# Patient Record
Sex: Female | Born: 1935 | Hispanic: No | Smoking: Current every day smoker
Health system: Southern US, Community
[De-identification: ages and names within clinical notes are randomized; demographics above are authoritative.]

## PROBLEM LIST (undated history)

## (undated) DIAGNOSIS — D649 Anemia, unspecified: Secondary | ICD-10-CM

## (undated) DIAGNOSIS — C801 Malignant (primary) neoplasm, unspecified: Secondary | ICD-10-CM

## (undated) DIAGNOSIS — Z87891 Personal history of nicotine dependence: Secondary | ICD-10-CM

## (undated) DIAGNOSIS — I10 Essential (primary) hypertension: Secondary | ICD-10-CM

## (undated) DIAGNOSIS — C50219 Malignant neoplasm of upper-inner quadrant of unspecified female breast: Secondary | ICD-10-CM

## (undated) DIAGNOSIS — I890 Lymphedema, not elsewhere classified: Secondary | ICD-10-CM

## (undated) DIAGNOSIS — E038 Other specified hypothyroidism: Secondary | ICD-10-CM

## (undated) DIAGNOSIS — J189 Pneumonia, unspecified organism: Secondary | ICD-10-CM

## (undated) DIAGNOSIS — E785 Hyperlipidemia, unspecified: Secondary | ICD-10-CM

## (undated) DIAGNOSIS — K639 Disease of intestine, unspecified: Secondary | ICD-10-CM

## (undated) DIAGNOSIS — R0602 Shortness of breath: Secondary | ICD-10-CM

## (undated) DIAGNOSIS — Z1211 Encounter for screening for malignant neoplasm of colon: Secondary | ICD-10-CM

## (undated) DIAGNOSIS — E669 Obesity, unspecified: Secondary | ICD-10-CM

## (undated) DIAGNOSIS — M1990 Unspecified osteoarthritis, unspecified site: Secondary | ICD-10-CM

## (undated) HISTORY — DX: Hyperlipidemia, unspecified: E78.5

## (undated) HISTORY — DX: Encounter for screening for malignant neoplasm of colon: Z12.11

## (undated) HISTORY — DX: Malignant (primary) neoplasm, unspecified: C80.1

## (undated) HISTORY — PX: CORNEAL TRANSPLANT: SHX108

## (undated) HISTORY — DX: Personal history of nicotine dependence: Z87.891

## (undated) HISTORY — DX: Other specified hypothyroidism: E03.8

## (undated) HISTORY — DX: Anemia, unspecified: D64.9

## (undated) HISTORY — DX: Pneumonia, unspecified organism: J18.9

## (undated) HISTORY — DX: Unspecified osteoarthritis, unspecified site: M19.90

## (undated) HISTORY — DX: Lymphedema, not elsewhere classified: I89.0

## (undated) HISTORY — DX: Essential (primary) hypertension: I10

## (undated) HISTORY — DX: Obesity, unspecified: E66.9

## (undated) HISTORY — DX: Disease of intestine, unspecified: K63.9

## (undated) HISTORY — DX: Malignant neoplasm of upper-inner quadrant of unspecified female breast: C50.219

## (undated) HISTORY — DX: Shortness of breath: R06.02

---

## 1963-11-14 HISTORY — PX: KNEE SURGERY: SHX244

## 1965-11-13 HISTORY — PX: BREAST SURGERY: SHX581

## 1983-11-14 HISTORY — PX: COLONOSCOPY: SHX174

## 1995-11-14 DIAGNOSIS — C801 Malignant (primary) neoplasm, unspecified: Secondary | ICD-10-CM

## 1995-11-14 HISTORY — DX: Malignant (primary) neoplasm, unspecified: C80.1

## 1996-02-12 HISTORY — PX: BASAL CELL CARCINOMA EXCISION: SHX1214

## 1996-11-13 HISTORY — PX: TYMPANOSTOMY TUBE PLACEMENT: SHX32

## 2008-11-03 ENCOUNTER — Ambulatory Visit: Payer: Self-pay | Admitting: Vascular Surgery

## 2008-11-12 ENCOUNTER — Ambulatory Visit: Payer: Self-pay | Admitting: Vascular Surgery

## 2008-11-18 ENCOUNTER — Inpatient Hospital Stay: Payer: Self-pay | Admitting: Vascular Surgery

## 2008-11-18 HISTORY — PX: ABDOMINAL AORTIC ANEURYSM REPAIR: SUR1152

## 2010-11-13 DIAGNOSIS — C50219 Malignant neoplasm of upper-inner quadrant of unspecified female breast: Secondary | ICD-10-CM

## 2010-11-13 DIAGNOSIS — J189 Pneumonia, unspecified organism: Secondary | ICD-10-CM

## 2010-11-13 DIAGNOSIS — E785 Hyperlipidemia, unspecified: Secondary | ICD-10-CM

## 2010-11-13 DIAGNOSIS — K639 Disease of intestine, unspecified: Secondary | ICD-10-CM

## 2010-11-13 DIAGNOSIS — R0602 Shortness of breath: Secondary | ICD-10-CM

## 2010-11-13 DIAGNOSIS — D649 Anemia, unspecified: Secondary | ICD-10-CM

## 2010-11-13 DIAGNOSIS — M1990 Unspecified osteoarthritis, unspecified site: Secondary | ICD-10-CM

## 2010-11-13 HISTORY — PX: OTHER SURGICAL HISTORY: SHX169

## 2010-11-13 HISTORY — PX: PORTACATH PLACEMENT: SHX2246

## 2010-11-13 HISTORY — DX: Malignant neoplasm of upper-inner quadrant of unspecified female breast: C50.219

## 2010-11-13 HISTORY — DX: Hyperlipidemia, unspecified: E78.5

## 2010-11-13 HISTORY — DX: Unspecified osteoarthritis, unspecified site: M19.90

## 2010-11-13 HISTORY — DX: Disease of intestine, unspecified: K63.9

## 2010-11-13 HISTORY — DX: Pneumonia, unspecified organism: J18.9

## 2010-11-13 HISTORY — DX: Anemia, unspecified: D64.9

## 2010-11-13 HISTORY — PX: MASTECTOMY: SHX3

## 2010-11-13 HISTORY — DX: Shortness of breath: R06.02

## 2011-02-17 ENCOUNTER — Emergency Department: Payer: Self-pay | Admitting: Emergency Medicine

## 2011-02-22 ENCOUNTER — Ambulatory Visit: Payer: Self-pay | Admitting: Internal Medicine

## 2011-02-23 LAB — CANCER ANTIGEN 27.29: CA 27.29: 102 U/mL — ABNORMAL HIGH (ref 0.0–38.6)

## 2011-02-27 ENCOUNTER — Ambulatory Visit: Payer: Self-pay | Admitting: Internal Medicine

## 2011-03-08 ENCOUNTER — Ambulatory Visit: Payer: Self-pay | Admitting: Vascular Surgery

## 2011-03-14 ENCOUNTER — Ambulatory Visit: Payer: Self-pay | Admitting: Internal Medicine

## 2011-03-31 ENCOUNTER — Encounter: Payer: Self-pay | Admitting: Internal Medicine

## 2011-04-14 ENCOUNTER — Ambulatory Visit: Payer: Self-pay | Admitting: Internal Medicine

## 2011-04-14 ENCOUNTER — Encounter: Payer: Self-pay | Admitting: Internal Medicine

## 2011-04-20 ENCOUNTER — Ambulatory Visit: Payer: Self-pay | Admitting: Internal Medicine

## 2011-05-03 ENCOUNTER — Inpatient Hospital Stay: Payer: Self-pay | Admitting: Specialist

## 2011-05-14 ENCOUNTER — Ambulatory Visit: Payer: Self-pay | Admitting: Internal Medicine

## 2011-06-14 ENCOUNTER — Ambulatory Visit: Payer: Self-pay | Admitting: Internal Medicine

## 2011-07-15 ENCOUNTER — Ambulatory Visit: Payer: Self-pay | Admitting: Internal Medicine

## 2011-07-28 ENCOUNTER — Observation Stay: Payer: Self-pay | Admitting: Oncology

## 2011-08-14 ENCOUNTER — Ambulatory Visit: Payer: Self-pay | Admitting: Internal Medicine

## 2011-08-24 ENCOUNTER — Encounter: Payer: Self-pay | Admitting: Internal Medicine

## 2011-09-14 ENCOUNTER — Ambulatory Visit: Payer: Self-pay | Admitting: Internal Medicine

## 2011-09-18 ENCOUNTER — Ambulatory Visit: Payer: Self-pay | Admitting: Internal Medicine

## 2011-09-26 ENCOUNTER — Ambulatory Visit: Payer: Self-pay | Admitting: Internal Medicine

## 2011-09-27 ENCOUNTER — Ambulatory Visit: Payer: Self-pay | Admitting: General Surgery

## 2011-10-14 ENCOUNTER — Ambulatory Visit: Payer: Self-pay | Admitting: Internal Medicine

## 2011-10-16 ENCOUNTER — Ambulatory Visit: Payer: Self-pay | Admitting: General Surgery

## 2011-10-17 ENCOUNTER — Ambulatory Visit: Payer: Self-pay | Admitting: Internal Medicine

## 2011-10-23 ENCOUNTER — Ambulatory Visit: Payer: Self-pay | Admitting: General Surgery

## 2011-10-25 LAB — PATHOLOGY REPORT

## 2011-11-14 ENCOUNTER — Ambulatory Visit: Payer: Self-pay | Admitting: Internal Medicine

## 2011-11-14 DIAGNOSIS — E669 Obesity, unspecified: Secondary | ICD-10-CM

## 2011-11-14 DIAGNOSIS — Z1211 Encounter for screening for malignant neoplasm of colon: Secondary | ICD-10-CM

## 2011-11-14 DIAGNOSIS — E038 Other specified hypothyroidism: Secondary | ICD-10-CM

## 2011-11-14 HISTORY — DX: Other specified hypothyroidism: E03.8

## 2011-11-14 HISTORY — DX: Encounter for screening for malignant neoplasm of colon: Z12.11

## 2011-11-14 HISTORY — DX: Obesity, unspecified: E66.9

## 2011-11-15 ENCOUNTER — Encounter: Payer: Self-pay | Admitting: Family Medicine

## 2011-11-21 ENCOUNTER — Ambulatory Visit: Payer: Self-pay | Admitting: Internal Medicine

## 2011-12-15 ENCOUNTER — Ambulatory Visit: Payer: Self-pay | Admitting: Internal Medicine

## 2011-12-19 LAB — CBC CANCER CENTER
Basophil #: 0 x10 3/mm (ref 0.0–0.1)
Basophil %: 0.4 %
Eosinophil #: 0.1 x10 3/mm (ref 0.0–0.7)
Eosinophil %: 1.4 %
HCT: 38.9 % (ref 35.0–47.0)
HGB: 13.3 g/dL (ref 12.0–16.0)
Lymphocyte #: 1 x10 3/mm (ref 1.0–3.6)
Lymphocyte %: 23.2 %
MCH: 33 pg (ref 26.0–34.0)
MCHC: 34.2 g/dL (ref 32.0–36.0)
Monocyte #: 0.5 x10 3/mm (ref 0.0–0.7)
Neutrophil #: 2.9 x10 3/mm (ref 1.4–6.5)
Platelet: 180 x10 3/mm (ref 150–440)
RBC: 4.04 10*6/uL (ref 3.80–5.20)
WBC: 4.4 x10 3/mm (ref 3.6–11.0)

## 2011-12-21 ENCOUNTER — Encounter: Payer: Self-pay | Admitting: Family Medicine

## 2011-12-26 LAB — CBC CANCER CENTER
Basophil %: 0.1 %
Eosinophil #: 0.1 x10 3/mm (ref 0.0–0.7)
Eosinophil %: 1 %
HCT: 39 % (ref 35.0–47.0)
HGB: 13.3 g/dL (ref 12.0–16.0)
Lymphocyte %: 12.8 %
MCH: 32.9 pg (ref 26.0–34.0)
MCHC: 34.1 g/dL (ref 32.0–36.0)
Monocyte #: 0.4 x10 3/mm (ref 0.0–0.7)
Monocyte %: 8 %
Neutrophil #: 4.3 x10 3/mm (ref 1.4–6.5)
Neutrophil %: 78.1 %
Platelet: 173 x10 3/mm (ref 150–440)
RBC: 4.04 10*6/uL (ref 3.80–5.20)

## 2012-01-02 LAB — CBC CANCER CENTER
Basophil #: 0 x10 3/mm (ref 0.0–0.1)
Basophil %: 0.3 %
Eosinophil #: 0.1 x10 3/mm (ref 0.0–0.7)
Eosinophil %: 1.4 %
HGB: 12.3 g/dL (ref 12.0–16.0)
Lymphocyte #: 0.6 x10 3/mm — ABNORMAL LOW (ref 1.0–3.6)
Lymphocyte %: 12.7 %
MCH: 32.6 pg (ref 26.0–34.0)
MCHC: 33.6 g/dL (ref 32.0–36.0)
Monocyte #: 0.5 x10 3/mm (ref 0.0–0.7)
Neutrophil %: 75.6 %
RDW: 14.5 % (ref 11.5–14.5)

## 2012-01-10 LAB — CBC CANCER CENTER
Basophil #: 0 x10 3/mm (ref 0.0–0.1)
Eosinophil #: 0 x10 3/mm (ref 0.0–0.7)
HCT: 36.9 % (ref 35.0–47.0)
Lymphocyte #: 0.6 x10 3/mm — ABNORMAL LOW (ref 1.0–3.6)
Lymphocyte %: 14.3 %
MCH: 32.6 pg (ref 26.0–34.0)
MCHC: 34.1 g/dL (ref 32.0–36.0)
Monocyte %: 11.8 %
Neutrophil #: 3 x10 3/mm (ref 1.4–6.5)
Platelet: 167 x10 3/mm (ref 150–440)
RDW: 14.5 % (ref 11.5–14.5)
WBC: 4.2 x10 3/mm (ref 3.6–11.0)

## 2012-01-12 ENCOUNTER — Ambulatory Visit: Payer: Self-pay | Admitting: Internal Medicine

## 2012-01-12 ENCOUNTER — Encounter: Payer: Self-pay | Admitting: Family Medicine

## 2012-01-23 LAB — CBC CANCER CENTER
Basophil #: 0 x10 3/mm (ref 0.0–0.1)
Eosinophil #: 0.1 x10 3/mm (ref 0.0–0.7)
Lymphocyte #: 0.5 x10 3/mm — ABNORMAL LOW (ref 1.0–3.6)
Lymphocyte %: 10.3 %
MCHC: 34.5 g/dL (ref 32.0–36.0)
MCV: 95 fL (ref 80–100)
Monocyte %: 8.7 %
Neutrophil #: 3.7 x10 3/mm (ref 1.4–6.5)
Neutrophil %: 79.6 %
Platelet: 177 x10 3/mm (ref 150–440)
RBC: 4.02 10*6/uL (ref 3.80–5.20)
RDW: 15 % — ABNORMAL HIGH (ref 11.5–14.5)
WBC: 4.7 x10 3/mm (ref 3.6–11.0)

## 2012-01-30 LAB — CBC CANCER CENTER
Basophil %: 0.2 %
Eosinophil %: 1 %
HCT: 37.2 % (ref 35.0–47.0)
Lymphocyte %: 11.2 %
MCHC: 34.3 g/dL (ref 32.0–36.0)
Monocyte %: 10.5 %
Neutrophil %: 77.1 %
Platelet: 169 x10 3/mm (ref 150–440)
RBC: 3.86 10*6/uL (ref 3.80–5.20)
RDW: 15 % — ABNORMAL HIGH (ref 11.5–14.5)
WBC: 4.3 x10 3/mm (ref 3.6–11.0)

## 2012-02-12 ENCOUNTER — Ambulatory Visit: Payer: Self-pay | Admitting: Internal Medicine

## 2012-02-23 LAB — CBC CANCER CENTER
Basophil %: 0.4 %
Eosinophil #: 0 x10 3/mm (ref 0.0–0.7)
Eosinophil %: 1 %
Lymphocyte #: 0.6 x10 3/mm — ABNORMAL LOW (ref 1.0–3.6)
MCH: 32.8 pg (ref 26.0–34.0)
MCV: 99 fL (ref 80–100)
Monocyte %: 12.9 %
Neutrophil #: 3.5 x10 3/mm (ref 1.4–6.5)
RBC: 3.85 10*6/uL (ref 3.80–5.20)
RDW: 14.7 % — ABNORMAL HIGH (ref 11.5–14.5)
WBC: 4.7 x10 3/mm (ref 3.6–11.0)

## 2012-02-23 LAB — COMPREHENSIVE METABOLIC PANEL
Albumin: 3.7 g/dL (ref 3.4–5.0)
Alkaline Phosphatase: 75 U/L (ref 50–136)
BUN: 18 mg/dL (ref 7–18)
Bilirubin,Total: 0.4 mg/dL (ref 0.2–1.0)
Chloride: 102 mmol/L (ref 98–107)
Co2: 28 mmol/L (ref 21–32)
EGFR (African American): >60
EGFR (Non-African Amer.): >60
Glucose: 93 mg/dL (ref 65–99)
Osmolality: 275 (ref 275–301)
Sodium: 137 mmol/L (ref 136–145)

## 2012-02-24 LAB — CANCER ANTIGEN 27.29: CA 27.29: 85.1 U/mL — ABNORMAL HIGH (ref 0.0–38.6)

## 2012-03-13 ENCOUNTER — Ambulatory Visit: Payer: Self-pay | Admitting: Internal Medicine

## 2012-04-13 ENCOUNTER — Ambulatory Visit: Payer: Self-pay | Admitting: Oncology

## 2012-05-13 ENCOUNTER — Ambulatory Visit: Payer: Self-pay | Admitting: Oncology

## 2012-05-31 ENCOUNTER — Ambulatory Visit: Payer: Self-pay | Admitting: Internal Medicine

## 2012-06-01 LAB — CANCER ANTIGEN 27.29: CA 27.29: 90.4 U/mL — ABNORMAL HIGH (ref 0.0–38.6)

## 2012-06-13 ENCOUNTER — Ambulatory Visit: Payer: Self-pay | Admitting: Oncology

## 2012-07-14 ENCOUNTER — Ambulatory Visit: Payer: Self-pay | Admitting: Oncology

## 2012-08-13 ENCOUNTER — Ambulatory Visit: Payer: Self-pay | Admitting: Oncology

## 2012-09-06 LAB — BASIC METABOLIC PANEL
BUN: 23 mg/dL — ABNORMAL HIGH (ref 7–18)
Chloride: 100 mmol/L (ref 98–107)
Creatinine: 0.91 mg/dL (ref 0.60–1.30)
EGFR (African American): >60
EGFR (Non-African Amer.): 58 — ABNORMAL LOW
Glucose: 97 mg/dL (ref 65–99)
Osmolality: 279 (ref 275–301)
Potassium: 4.3 mmol/L (ref 3.5–5.1)
Sodium: 138 mmol/L (ref 136–145)

## 2012-09-06 LAB — MAGNESIUM: Magnesium: 2 mg/dL

## 2012-09-13 ENCOUNTER — Ambulatory Visit: Payer: Self-pay

## 2012-09-13 ENCOUNTER — Ambulatory Visit: Payer: Self-pay | Admitting: Oncology

## 2012-09-30 ENCOUNTER — Ambulatory Visit: Payer: Self-pay | Admitting: General Surgery

## 2012-10-23 ENCOUNTER — Ambulatory Visit: Payer: Self-pay | Admitting: Oncology

## 2012-11-13 ENCOUNTER — Ambulatory Visit: Payer: Self-pay | Admitting: Oncology

## 2012-12-14 ENCOUNTER — Ambulatory Visit: Payer: Self-pay | Admitting: Oncology

## 2013-01-11 ENCOUNTER — Ambulatory Visit: Payer: Self-pay | Admitting: Oncology

## 2013-02-11 ENCOUNTER — Ambulatory Visit: Payer: Self-pay | Admitting: Oncology

## 2013-03-07 LAB — CANCER ANTIGEN 27.29: CA 27.29: 153 U/mL — ABNORMAL HIGH (ref 0.0–38.6)

## 2013-03-13 ENCOUNTER — Ambulatory Visit: Payer: Self-pay | Admitting: Oncology

## 2013-04-16 ENCOUNTER — Ambulatory Visit: Payer: Self-pay | Admitting: Oncology

## 2013-04-24 LAB — CREATININE, SERUM: Creatinine: 0.94 mg/dL (ref 0.60–1.30)

## 2013-04-29 ENCOUNTER — Encounter: Payer: Self-pay | Admitting: *Deleted

## 2013-04-29 DIAGNOSIS — I890 Lymphedema, not elsewhere classified: Secondary | ICD-10-CM | POA: Insufficient documentation

## 2013-04-29 DIAGNOSIS — D649 Anemia, unspecified: Secondary | ICD-10-CM | POA: Insufficient documentation

## 2013-04-29 DIAGNOSIS — C50219 Malignant neoplasm of upper-inner quadrant of unspecified female breast: Secondary | ICD-10-CM | POA: Insufficient documentation

## 2013-04-29 DIAGNOSIS — C801 Malignant (primary) neoplasm, unspecified: Secondary | ICD-10-CM | POA: Insufficient documentation

## 2013-05-07 LAB — PROTIME-INR
INR: 0.9
Prothrombin Time: 12.8 secs (ref 11.5–14.7)

## 2013-05-07 LAB — APTT: Activated PTT: 31.5 secs (ref 23.6–35.9)

## 2013-05-07 LAB — PLATELET COUNT: Platelet: 231 10*3/uL (ref 150–440)

## 2013-05-09 ENCOUNTER — Ambulatory Visit: Payer: Self-pay | Admitting: Oncology

## 2013-05-13 ENCOUNTER — Ambulatory Visit: Payer: Self-pay | Admitting: Oncology

## 2013-05-19 ENCOUNTER — Ambulatory Visit: Payer: Self-pay | Admitting: Oncology

## 2013-05-20 LAB — CANCER ANTIGEN 27.29: CA 27.29: 394.9 U/mL — ABNORMAL HIGH (ref 0.0–38.6)

## 2013-05-26 LAB — COMPREHENSIVE METABOLIC PANEL
Albumin: 3.1 g/dL — ABNORMAL LOW (ref 3.4–5.0)
BUN: 14 mg/dL (ref 7–18)
Bilirubin,Total: 0.3 mg/dL (ref 0.2–1.0)
Calcium, Total: 9.1 mg/dL (ref 8.5–10.1)
Chloride: 95 mmol/L — ABNORMAL LOW (ref 98–107)
EGFR (Non-African Amer.): >60
Glucose: 130 mg/dL — ABNORMAL HIGH (ref 65–99)
Osmolality: 267 (ref 275–301)
SGOT(AST): 22 U/L (ref 15–37)
SGPT (ALT): 20 U/L (ref 12–78)

## 2013-05-26 LAB — CBC CANCER CENTER
Eosinophil #: 0 x10 3/mm (ref 0.0–0.7)
HCT: 35.5 % (ref 35.0–47.0)
HGB: 12.4 g/dL (ref 12.0–16.0)
Lymphocyte %: 7.9 %
MCHC: 35 g/dL (ref 32.0–36.0)
Monocyte #: 0.7 x10 3/mm (ref 0.2–0.9)
Monocyte %: 9 %
Neutrophil %: 82.3 %
RBC: 3.75 10*6/uL — ABNORMAL LOW (ref 3.80–5.20)
RDW: 13.7 % (ref 11.5–14.5)

## 2013-06-02 ENCOUNTER — Inpatient Hospital Stay: Payer: Self-pay | Admitting: Internal Medicine

## 2013-06-02 LAB — COMPREHENSIVE METABOLIC PANEL
Albumin: 3.3 g/dL — ABNORMAL LOW (ref 3.4–5.0)
Alkaline Phosphatase: 73 U/L (ref 50–136)
Bilirubin,Total: 0.3 mg/dL (ref 0.2–1.0)
Chloride: 96 mmol/L — ABNORMAL LOW (ref 98–107)
EGFR (Non-African Amer.): >60
Glucose: 100 mg/dL — ABNORMAL HIGH (ref 65–99)
SGOT(AST): 29 U/L (ref 15–37)
SGPT (ALT): 25 U/L (ref 12–78)
Sodium: 131 mmol/L — ABNORMAL LOW (ref 136–145)

## 2013-06-02 LAB — CBC
HCT: 36 % (ref 35.0–47.0)
HGB: 12.4 g/dL (ref 12.0–16.0)
MCH: 32.2 pg (ref 26.0–34.0)
Platelet: 272 10*3/uL (ref 150–440)
RBC: 3.86 10*6/uL (ref 3.80–5.20)
RDW: 13.7 % (ref 11.5–14.5)

## 2013-06-02 LAB — PRO B NATRIURETIC PEPTIDE: B-Type Natriuretic Peptide: 270 pg/mL (ref 0–450)

## 2013-06-02 LAB — CK TOTAL AND CKMB (NOT AT ARMC)
CK, Total: 93 U/L (ref 21–215)
CK, Total: 88 U/L (ref 21–215)

## 2013-06-03 LAB — BASIC METABOLIC PANEL
BUN: 13 mg/dL (ref 7–18)
Calcium, Total: 8.8 mg/dL (ref 8.5–10.1)
Co2: 26 mmol/L (ref 21–32)
Creatinine: 0.79 mg/dL (ref 0.60–1.30)
EGFR (African American): >60
Glucose: 152 mg/dL — ABNORMAL HIGH (ref 65–99)
Osmolality: 264 (ref 275–301)
Sodium: 130 mmol/L — ABNORMAL LOW (ref 136–145)

## 2013-06-03 LAB — CBC WITH DIFFERENTIAL/PLATELET
Basophil #: 0 10*3/uL (ref 0.0–0.1)
Basophil %: 0.1 %
Eosinophil #: 0 10*3/uL (ref 0.0–0.7)
Eosinophil %: 0.1 %
HCT: 32.9 % — ABNORMAL LOW (ref 35.0–47.0)
Lymphocyte %: 4.3 %
Monocyte %: 1 %
Platelet: 236 10*3/uL (ref 150–440)
RDW: 13.6 % (ref 11.5–14.5)
WBC: 4 10*3/uL (ref 3.6–11.0)

## 2013-06-03 LAB — CK TOTAL AND CKMB (NOT AT ARMC): CK-MB: 3 ng/mL (ref 0.5–3.6)

## 2013-06-03 LAB — TROPONIN I: Troponin-I: <0.02 ng/mL

## 2013-06-04 LAB — ALBUMIN, FLUID (OTHER): Body Fluid Albumin: 2.5 g/dL

## 2013-06-04 LAB — LACTATE DEHYDROGENASE, PLEURAL OR PERITONEAL FLUID: LDH, Body Fluid: 126 U/L

## 2013-06-04 LAB — PROTIME-INR
INR: 0.9
Prothrombin Time: 12.8 secs (ref 11.5–14.7)

## 2013-06-07 LAB — CREATININE, SERUM
Creatinine: 0.69 mg/dL (ref 0.60–1.30)
EGFR (Non-African Amer.): >60

## 2013-06-10 LAB — CBC WITH DIFFERENTIAL/PLATELET
Eosinophil %: 0 %
HCT: 35.9 % (ref 35.0–47.0)
Lymphocyte #: 0.3 10*3/uL — ABNORMAL LOW (ref 1.0–3.6)
Lymphocyte %: 1.7 %
MCH: 32.3 pg (ref 26.0–34.0)
MCHC: 34 g/dL (ref 32.0–36.0)
MCV: 95 fL (ref 80–100)
Monocyte #: 0.7 x10 3/mm (ref 0.2–0.9)
Monocyte %: 3.6 %
Neutrophil #: 18.9 10*3/uL — ABNORMAL HIGH (ref 1.4–6.5)
Platelet: 215 10*3/uL (ref 150–440)
RBC: 3.78 10*6/uL — ABNORMAL LOW (ref 3.80–5.20)
RDW: 14.6 % — ABNORMAL HIGH (ref 11.5–14.5)
WBC: 20 10*3/uL — ABNORMAL HIGH (ref 3.6–11.0)

## 2013-06-10 LAB — COMPREHENSIVE METABOLIC PANEL
Albumin: 2.4 g/dL — ABNORMAL LOW (ref 3.4–5.0)
Alkaline Phosphatase: 48 U/L — ABNORMAL LOW (ref 50–136)
BUN: 20 mg/dL — ABNORMAL HIGH (ref 7–18)
Bilirubin,Total: 0.3 mg/dL (ref 0.2–1.0)
Co2: 30 mmol/L (ref 21–32)
Creatinine: 0.72 mg/dL (ref 0.60–1.30)
EGFR (African American): >60
EGFR (Non-African Amer.): >60
Osmolality: 268 (ref 275–301)
Potassium: 4.5 mmol/L (ref 3.5–5.1)
SGOT(AST): 20 U/L (ref 15–37)
Sodium: 132 mmol/L — ABNORMAL LOW (ref 136–145)

## 2013-06-13 ENCOUNTER — Ambulatory Visit: Payer: Self-pay | Admitting: Oncology

## 2013-06-16 ENCOUNTER — Non-Acute Institutional Stay (SKILLED_NURSING_FACILITY): Payer: Medicare Other | Admitting: Internal Medicine

## 2013-06-16 ENCOUNTER — Ambulatory Visit: Payer: Self-pay | Admitting: Oncology

## 2013-06-16 DIAGNOSIS — J910 Malignant pleural effusion: Secondary | ICD-10-CM

## 2013-06-16 DIAGNOSIS — C50219 Malignant neoplasm of upper-inner quadrant of unspecified female breast: Secondary | ICD-10-CM

## 2013-06-16 DIAGNOSIS — I5023 Acute on chronic systolic (congestive) heart failure: Secondary | ICD-10-CM

## 2013-06-16 DIAGNOSIS — J441 Chronic obstructive pulmonary disease with (acute) exacerbation: Secondary | ICD-10-CM | POA: Insufficient documentation

## 2013-06-16 DIAGNOSIS — I890 Lymphedema, not elsewhere classified: Secondary | ICD-10-CM

## 2013-06-16 DIAGNOSIS — J449 Chronic obstructive pulmonary disease, unspecified: Secondary | ICD-10-CM

## 2013-06-16 DIAGNOSIS — K219 Gastro-esophageal reflux disease without esophagitis: Secondary | ICD-10-CM

## 2013-06-16 DIAGNOSIS — R531 Weakness: Secondary | ICD-10-CM

## 2013-06-16 DIAGNOSIS — I509 Heart failure, unspecified: Secondary | ICD-10-CM

## 2013-06-16 DIAGNOSIS — F411 Generalized anxiety disorder: Secondary | ICD-10-CM

## 2013-06-16 DIAGNOSIS — R5381 Other malaise: Secondary | ICD-10-CM

## 2013-06-16 DIAGNOSIS — E785 Hyperlipidemia, unspecified: Secondary | ICD-10-CM

## 2013-06-16 DIAGNOSIS — R5383 Other fatigue: Secondary | ICD-10-CM

## 2013-06-16 LAB — CBC CANCER CENTER
Basophil #: 0 x10 3/mm (ref 0.0–0.1)
Basophil %: 0.4 %
HGB: 9 g/dL — ABNORMAL LOW (ref 12.0–16.0)
Lymphocyte #: 0.6 x10 3/mm — ABNORMAL LOW (ref 1.0–3.6)
Lymphocyte %: 7.8 %
MCHC: 32.6 g/dL (ref 32.0–36.0)
MCV: 94 fL (ref 80–100)
Monocyte %: 10.2 %
Neutrophil #: 5.8 x10 3/mm (ref 1.4–6.5)
Platelet: 215 x10 3/mm (ref 150–440)
RBC: 2.92 10*6/uL — ABNORMAL LOW (ref 3.80–5.20)
RDW: 14.3 % (ref 11.5–14.5)
WBC: 7.2 x10 3/mm (ref 3.6–11.0)

## 2013-06-16 LAB — COMPREHENSIVE METABOLIC PANEL
Bilirubin,Total: 0.2 mg/dL (ref 0.2–1.0)
Chloride: 103 mmol/L (ref 98–107)
Co2: 33 mmol/L — ABNORMAL HIGH (ref 21–32)
Creatinine: 0.73 mg/dL (ref 0.60–1.30)
EGFR (African American): >60
EGFR (Non-African Amer.): >60
Osmolality: 277 (ref 275–301)
Potassium: 3.7 mmol/L (ref 3.5–5.1)
SGOT(AST): 29 U/L (ref 15–37)
SGPT (ALT): 29 U/L (ref 12–78)
Sodium: 139 mmol/L (ref 136–145)
Total Protein: 6.1 g/dL — ABNORMAL LOW (ref 6.4–8.2)

## 2013-06-16 NOTE — Progress Notes (Signed)
Patient ID: Courtney Chang, female   DOB: December 31, 1925, 79 y.o.   MRN: [id number]   [facility]  Code Status: full code  Allergies  Allergen Reactions  . Lactose Intolerance (Gi) Other (See Comments)    GI problems  . Penicillins Swelling and Other (See Comments)    Breathing problems  . Prednisone Swelling  . Premarin (Estrogens Conjugated)   . Tape Rash    Chief Complaint  Patient presents with  . Hospitalization Follow-up    HPI:  79 y/o female patient was in the hospital from 06/02/13 to 06/13/13. She has hx of malignant pleural effusion due to metastatic breast cancer and copd. She was admitted with SOB and had acute respiratory failure and had thoracocentesis for her left sided pleural effusion. She also had copd exacerbation and was given a course of steroids and po levaquin. 1100 ml fluid was removed from her lung and her breathing improved. Her chest tube was removed and suture placed. Given her generalized weakness, she was sent to SNF for STR.  She was seen in her room today with her daughter present. She is on o2 by nasal canula and denies any complaints. Says her breakfast was good and her appetite is improving. She has worked with therapy and has good things to say about the care she has been getting here. She is going for her chemotherapy to the cancer centre today.  Review of Systems  Constitutional: Positive for malaise/fatigue. Negative for fever, weight loss and diaphoresis.  HENT: Negative for congestion.   Eyes: Negative for blurred vision.  Respiratory: Negative for cough and shortness of breath.   Cardiovascular: Positive for leg swelling. Negative for chest pain and palpitations.  Gastrointestinal: Negative for heartburn, nausea, vomiting and diarrhea.  Genitourinary: Negative for dysuria.  Musculoskeletal: Negative for falls.  Skin: Negative for rash.  Neurological: Negative for dizziness, focal weakness and headaches.  Psychiatric/Behavioral: Negative for  depression.    Past Medical History  Diagnosis Date  . Anemia 2012    transfusions 2012  . Other specified acquired hypothyroidism 2013  . Arthritis 2012  . Cancer 1997    right breast  . Cancer 2012    right mastectomy  . Hypertension   . Personal history of tobacco use, presenting hazards to health   . Other lymphedema   . Special screening for malignant neoplasms, colon 2013  . Hyperlipidemia 2012  . SOB (shortness of breath) 2012    also wheezing  . Obesity, unspecified 2013  . Malignant neoplasm of upper-inner quadrant of female breast 2012    right breast  . Bowel trouble 2012  . Pneumonia 2012    June   Past Surgical History  Procedure Laterality Date  . Abdominal aortic aneurysm repair  01.06.2010    done by Dr. Wyn Quaker  . Corneal transplant Bilateral 03/2001;06/03/2001    left eye, right eye  . Breast surgery Right 1967    fibroadenoma right breast, 2 surgeries within 16 months  . Tympanostomy tube placement Bilateral 1998    bilateral and tubes  . Basal cell carcinoma excision  4/97    right face near ear  . Knee surgery Right 1965    bursa  . Colonoscopy  1985    ???  . Portacath placement  2012  . Mastectomy Right 2012    mod. radical, chemotherapy.  Marland Kitchen Axillary mass Right 2012    large mass with skin erosion, biopsy showed invasive mammary carcer. Pt has completed radiation therapy  Social History:   reports that she has been smoking Cigarettes.  She has a 50 pack-year smoking history. She does not have any smokeless tobacco history on file. She reports that she does not drink alcohol or use illicit drugs.  No family history on file.  Medications: Patient's Medications  New Prescriptions   No medications on file  Previous Medications   ACETAMINOPHEN (TYLENOL) 325 MG TABLET    Take 650 mg by mouth every 4 (four) hours as needed for pain.   ALBUTEROL (PROVENTIL HFA;VENTOLIN HFA) 108 (90 BASE) MCG/ACT INHALER    Inhale 2 puffs into the lungs every 4  (four) hours as needed for wheezing.   ATORVASTATIN (LIPITOR) 40 MG TABLET    Take 40 mg by mouth daily.   BUDESONIDE-FORMOTEROL (SYMBICORT) 160-4.5 MCG/ACT INHALER    Inhale 2 puffs into the lungs 2 (two) times daily.   CARISOPRODOL (SOMA) 350 MG TABLET    Take 350 mg by mouth 3 (three) times daily as needed for muscle spasms.   FUROSEMIDE (LASIX) 20 MG TABLET    Take 20 mg by mouth daily.   HYDROCODONE-ACETAMINOPHEN (NORCO/VICODIN) 5-325 MG PER TABLET    Take 1 tablet by mouth every 4 (four) hours as needed for pain.   IPRATROPIUM-ALBUTEROL (DUONEB) 0.5-2.5 (3) MG/3ML SOLN    Take 3 mLs by nebulization every 4 (four) hours as needed.   LEVOTHYROXINE (SYNTHROID, LEVOTHROID) 50 MCG TABLET    Take 50 mcg by mouth daily before breakfast.   LISINOPRIL (PRINIVIL,ZESTRIL) 20 MG TABLET    Take 20 mg by mouth daily.   LORAZEPAM (ATIVAN) 0.5 MG TABLET    Take 0.5 mg by mouth every 6 (six) hours as needed for anxiety.   METOPROLOL SUCCINATE (TOPROL-XL) 25 MG 24 HR TABLET    Take 25 mg by mouth daily.   MONTELUKAST (SINGULAIR) 10 MG TABLET    Take 10 mg by mouth at bedtime.   MULTIVITAMIN-LUTEIN (OCUVITE-LUTEIN) CAPS CAPSULE    Take 1 capsule by mouth daily.   OMEGA-3 ACID ETHYL ESTERS (LOVAZA) 1 G CAPSULE    Take 2 g by mouth daily.   ONDANSETRON (ZOFRAN) 4 MG TABLET    Take 4 mg by mouth every 6 (six) hours as needed for nausea.   PANTOPRAZOLE (PROTONIX) 40 MG TABLET    Take 40 mg by mouth daily.   TRAZODONE (DESYREL) 50 MG TABLET    Take 50 mg by mouth at bedtime.   VITAMIN C (ASCORBIC ACID) 500 MG TABLET    Take 500 mg by mouth daily.  Modified Medications   No medications on file  Discontinued Medications   No medications on file     Physical Exam: Filed Vitals:   06/16/13 1743  BP: 100/44  Pulse: 73  Temp: 97.6 F (36.4 C)  Resp: 20   gen- elderly female patient in bed, in NAD, on o2 by nasal canula heent- no pallor, no icterus, no LAD, MMM, no JVD cvs- ns 1,s2, rrr respi- decreased  air entry at bases bilaterally and has scattered wheezing abdo- bs+, soft, non tender Skin- suture in place on 2 incision  On left lateral chest area, incision site clean Ext- right arm lymphedema, compression sleeves on the side, b/l LE edema with left > right Neuro- aaox 3, no focal deficit   Assessment/Plan  Generalized weakness- in setting of malignancy, recurrent effusion and other co-morbidities with deconditioning. Will need to work with PT/OT as tolerated. Encouraged po intake. Daughter mentions patient particular  about her food. Will have dietician referral to go over list of her likes so that can be provided. Skin care. Continue prn pain and muscle spasm medications  Pleural effusion- malignant effusion, has undergone talc procedure in past and recent thoracocentesis. Monitor clinically. Continue o2 therapy and breathing treatment with diuresis  Anxiety- continue ativan for now  Copd- recent exacerbation, completed steroid and antibiotics course. Continue bronchodilator treatment for now  chf- chronic medical issue. Continue lasix, lisinopril and toprol with statin. Monitor weight closely and vitals  Hyperlipidemia- continue statin, monitor  Hypothyroidism- continue levothyroxine and monitor clinically  gerd- continue ppi  Breast cancer- undergoing chemotherapy and is seeing dr Orlie Dakin today  Family/ staff Communication: reviewed care plan with patient, nursing supervisor, patient's daughter, answered questions/ concerns from daughter   Labs/tests ordered- cbc, cmp

## 2013-06-23 LAB — COMPREHENSIVE METABOLIC PANEL
Albumin: 2 g/dL — ABNORMAL LOW (ref 3.4–5.0)
Anion Gap: 3 — ABNORMAL LOW (ref 7–16)
Calcium, Total: 8.3 mg/dL — ABNORMAL LOW (ref 8.5–10.1)
Co2: 36 mmol/L — ABNORMAL HIGH (ref 21–32)
EGFR (African American): 58 — ABNORMAL LOW
Glucose: 143 mg/dL — ABNORMAL HIGH (ref 65–99)
Osmolality: 285 (ref 275–301)
Potassium: 3.5 mmol/L (ref 3.5–5.1)
SGPT (ALT): 25 U/L (ref 12–78)
Sodium: 139 mmol/L (ref 136–145)

## 2013-06-23 LAB — CBC CANCER CENTER
Basophil #: 0 x10 3/mm (ref 0.0–0.1)
Basophil %: 0.3 %
Eosinophil #: 0.1 x10 3/mm (ref 0.0–0.7)
Eosinophil %: 1.5 %
HCT: 22.6 % — ABNORMAL LOW (ref 35.0–47.0)
Lymphocyte #: 0.2 x10 3/mm — ABNORMAL LOW (ref 1.0–3.6)
MCH: 32.3 pg (ref 26.0–34.0)
MCHC: 34.1 g/dL (ref 32.0–36.0)
MCV: 95 fL (ref 80–100)
Monocyte %: 11.6 %
Neutrophil #: 2.6 x10 3/mm (ref 1.4–6.5)
Neutrophil %: 79.6 %
RBC: 2.39 10*6/uL — ABNORMAL LOW (ref 3.80–5.20)
WBC: 3.3 x10 3/mm — ABNORMAL LOW (ref 3.6–11.0)

## 2013-06-25 ENCOUNTER — Inpatient Hospital Stay: Payer: Self-pay | Admitting: Specialist

## 2013-06-25 LAB — COMPREHENSIVE METABOLIC PANEL
Alkaline Phosphatase: 89 U/L (ref 50–136)
Anion Gap: 7 (ref 7–16)
BUN: 19 mg/dL — ABNORMAL HIGH (ref 7–18)
Calcium, Total: 9.1 mg/dL (ref 8.5–10.1)
Chloride: 100 mmol/L (ref 98–107)
Creatinine: 0.78 mg/dL (ref 0.60–1.30)
EGFR (African American): >60
EGFR (Non-African Amer.): >60
Glucose: 97 mg/dL (ref 65–99)
Sodium: 138 mmol/L (ref 136–145)

## 2013-06-25 LAB — URINALYSIS, COMPLETE
Glucose,UR: NEGATIVE mg/dL (ref 0–75)
Nitrite: NEGATIVE
Protein: NEGATIVE
RBC,UR: 1 /HPF (ref 0–5)
Specific Gravity: 1.005 (ref 1.003–1.030)
Squamous Epithelial: 6
WBC UR: 2 /HPF (ref 0–5)

## 2013-06-25 LAB — DIFFERENTIAL
Basophil %: 1 %
Eosinophil %: 1.3 %
Lymphocyte #: 0.4 10*3/uL — ABNORMAL LOW (ref 1.0–3.6)
Monocyte %: 26.6 %
Neutrophil %: 58.1 %

## 2013-06-25 LAB — CBC
HCT: 25.4 % — ABNORMAL LOW (ref 35.0–47.0)
MCHC: 33.7 g/dL (ref 32.0–36.0)
MCV: 93 fL (ref 80–100)
Platelet: 344 10*3/uL (ref 150–440)
WBC: 2.8 10*3/uL — ABNORMAL LOW (ref 3.6–11.0)

## 2013-06-25 LAB — PRO B NATRIURETIC PEPTIDE: B-Type Natriuretic Peptide: 738 pg/mL — ABNORMAL HIGH (ref 0–450)

## 2013-06-25 LAB — TROPONIN I: Troponin-I: <0.02 ng/mL

## 2013-06-26 LAB — CBC WITH DIFFERENTIAL/PLATELET
Basophil #: 0 10*3/uL (ref 0.0–0.1)
Basophil %: 0.8 %
Eosinophil #: 0 10*3/uL (ref 0.0–0.7)
Eosinophil %: 0.7 %
HCT: 24.1 % — ABNORMAL LOW (ref 35.0–47.0)
Lymphocyte #: 0.6 10*3/uL — ABNORMAL LOW (ref 1.0–3.6)
MCH: 31.2 pg (ref 26.0–34.0)
MCHC: 33.6 g/dL (ref 32.0–36.0)
MCV: 93 fL (ref 80–100)
Monocyte #: 1 x10 3/mm — ABNORMAL HIGH (ref 0.2–0.9)
Monocyte %: 28.4 %
Neutrophil #: 1.8 10*3/uL (ref 1.4–6.5)
Neutrophil %: 52.5 %
Platelet: 336 10*3/uL (ref 150–440)
RBC: 2.59 10*6/uL — ABNORMAL LOW (ref 3.80–5.20)

## 2013-06-26 LAB — BASIC METABOLIC PANEL
Anion Gap: 6 — ABNORMAL LOW (ref 7–16)
BUN: 12 mg/dL (ref 7–18)
Calcium, Total: 8.9 mg/dL (ref 8.5–10.1)
Chloride: 100 mmol/L (ref 98–107)
Co2: 33 mmol/L — ABNORMAL HIGH (ref 21–32)
EGFR (African American): >60

## 2013-06-26 LAB — MAGNESIUM: Magnesium: 1.7 mg/dL — ABNORMAL LOW

## 2013-06-27 LAB — CBC WITH DIFFERENTIAL/PLATELET
Basophil %: 0.4 %
Eosinophil #: 0 10*3/uL (ref 0.0–0.7)
HCT: 21.6 % — ABNORMAL LOW (ref 35.0–47.0)
Lymphocyte #: 0.4 10*3/uL — ABNORMAL LOW (ref 1.0–3.6)
Lymphocyte %: 9.4 %
MCH: 31.8 pg (ref 26.0–34.0)
MCHC: 34.6 g/dL (ref 32.0–36.0)
MCV: 92 fL (ref 80–100)
Monocyte #: 1 x10 3/mm — ABNORMAL HIGH (ref 0.2–0.9)
Monocyte %: 24.1 %
Neutrophil #: 2.8 10*3/uL (ref 1.4–6.5)
Neutrophil %: 65.5 %
Platelet: 307 10*3/uL (ref 150–440)
RBC: 2.34 10*6/uL — ABNORMAL LOW (ref 3.80–5.20)
RDW: 14.7 % — ABNORMAL HIGH (ref 11.5–14.5)
WBC: 4.3 10*3/uL (ref 3.6–11.0)

## 2013-06-27 LAB — BASIC METABOLIC PANEL
BUN: 9 mg/dL (ref 7–18)
Calcium, Total: 8.6 mg/dL (ref 8.5–10.1)
Chloride: 101 mmol/L (ref 98–107)
Co2: 33 mmol/L — ABNORMAL HIGH (ref 21–32)
EGFR (Non-African Amer.): >60
Potassium: 3.4 mmol/L — ABNORMAL LOW (ref 3.5–5.1)

## 2013-06-29 LAB — CBC WITH DIFFERENTIAL/PLATELET
Basophil %: 0.1 %
Eosinophil %: 0 %
HCT: 24 % — ABNORMAL LOW (ref 35.0–47.0)
HGB: 8.1 g/dL — ABNORMAL LOW (ref 12.0–16.0)
Lymphocyte #: 0.3 10*3/uL — ABNORMAL LOW (ref 1.0–3.6)
Lymphocyte %: 4.7 %
MCH: 31.1 pg (ref 26.0–34.0)
Monocyte #: 0.1 x10 3/mm — ABNORMAL LOW (ref 0.2–0.9)
RBC: 2.61 10*6/uL — ABNORMAL LOW (ref 3.80–5.20)
RDW: 14.8 % — ABNORMAL HIGH (ref 11.5–14.5)

## 2013-06-29 LAB — BASIC METABOLIC PANEL
Anion Gap: 6 — ABNORMAL LOW (ref 7–16)
BUN: 10 mg/dL (ref 7–18)
Chloride: 101 mmol/L (ref 98–107)
Co2: 31 mmol/L (ref 21–32)
Creatinine: 0.69 mg/dL (ref 0.60–1.30)
EGFR (African American): >60
EGFR (Non-African Amer.): >60
Glucose: 184 mg/dL — ABNORMAL HIGH (ref 65–99)
Sodium: 138 mmol/L (ref 136–145)

## 2013-07-03 LAB — BASIC METABOLIC PANEL
BUN: 28 mg/dL — ABNORMAL HIGH (ref 7–18)
Chloride: 98 mmol/L (ref 98–107)
Co2: 35 mmol/L — ABNORMAL HIGH (ref 21–32)
Creatinine: 0.79 mg/dL (ref 0.60–1.30)
EGFR (African American): >60
EGFR (Non-African Amer.): >60
Osmolality: 278 (ref 275–301)
Potassium: 4.6 mmol/L (ref 3.5–5.1)
Sodium: 135 mmol/L — ABNORMAL LOW (ref 136–145)

## 2013-07-03 LAB — CBC WITH DIFFERENTIAL/PLATELET
Basophil #: 0.1 10*3/uL (ref 0.0–0.1)
Eosinophil %: 0 %
HCT: 26.5 % — ABNORMAL LOW (ref 35.0–47.0)
Lymphocyte #: 0.4 10*3/uL — ABNORMAL LOW (ref 1.0–3.6)
Lymphocyte %: 3.4 %
MCH: 30.6 pg (ref 26.0–34.0)
MCHC: 33.2 g/dL (ref 32.0–36.0)
MCV: 92 fL (ref 80–100)
Monocyte %: 3.9 %
Neutrophil #: 11.7 10*3/uL — ABNORMAL HIGH (ref 1.4–6.5)
Neutrophil %: 92 %
RBC: 2.88 10*6/uL — ABNORMAL LOW (ref 3.80–5.20)
RDW: 14.8 % — ABNORMAL HIGH (ref 11.5–14.5)

## 2013-07-14 ENCOUNTER — Ambulatory Visit: Payer: Self-pay | Admitting: Oncology

## 2013-07-14 DEATH — deceased

## 2013-10-23 ENCOUNTER — Ambulatory Visit: Payer: Medicare Other | Admitting: General Surgery

## 2014-09-14 ENCOUNTER — Encounter: Payer: Self-pay | Admitting: *Deleted

## 2014-10-14 IMAGING — CR DG CHEST POST BIOPSY - THORACENTESIS
1 series · 1 of 1 positions shown · non-contrast
Comparison: none

REASON FOR EXAM: Pleural Effusion Left
COMMENTS:

PROCEDURE:     DXR - DXR CHEST 1 VIEW ALRIC ANGOT  - May 09, 2013  [DATE]
RESULT:     Comparison: None

[w chest pa]
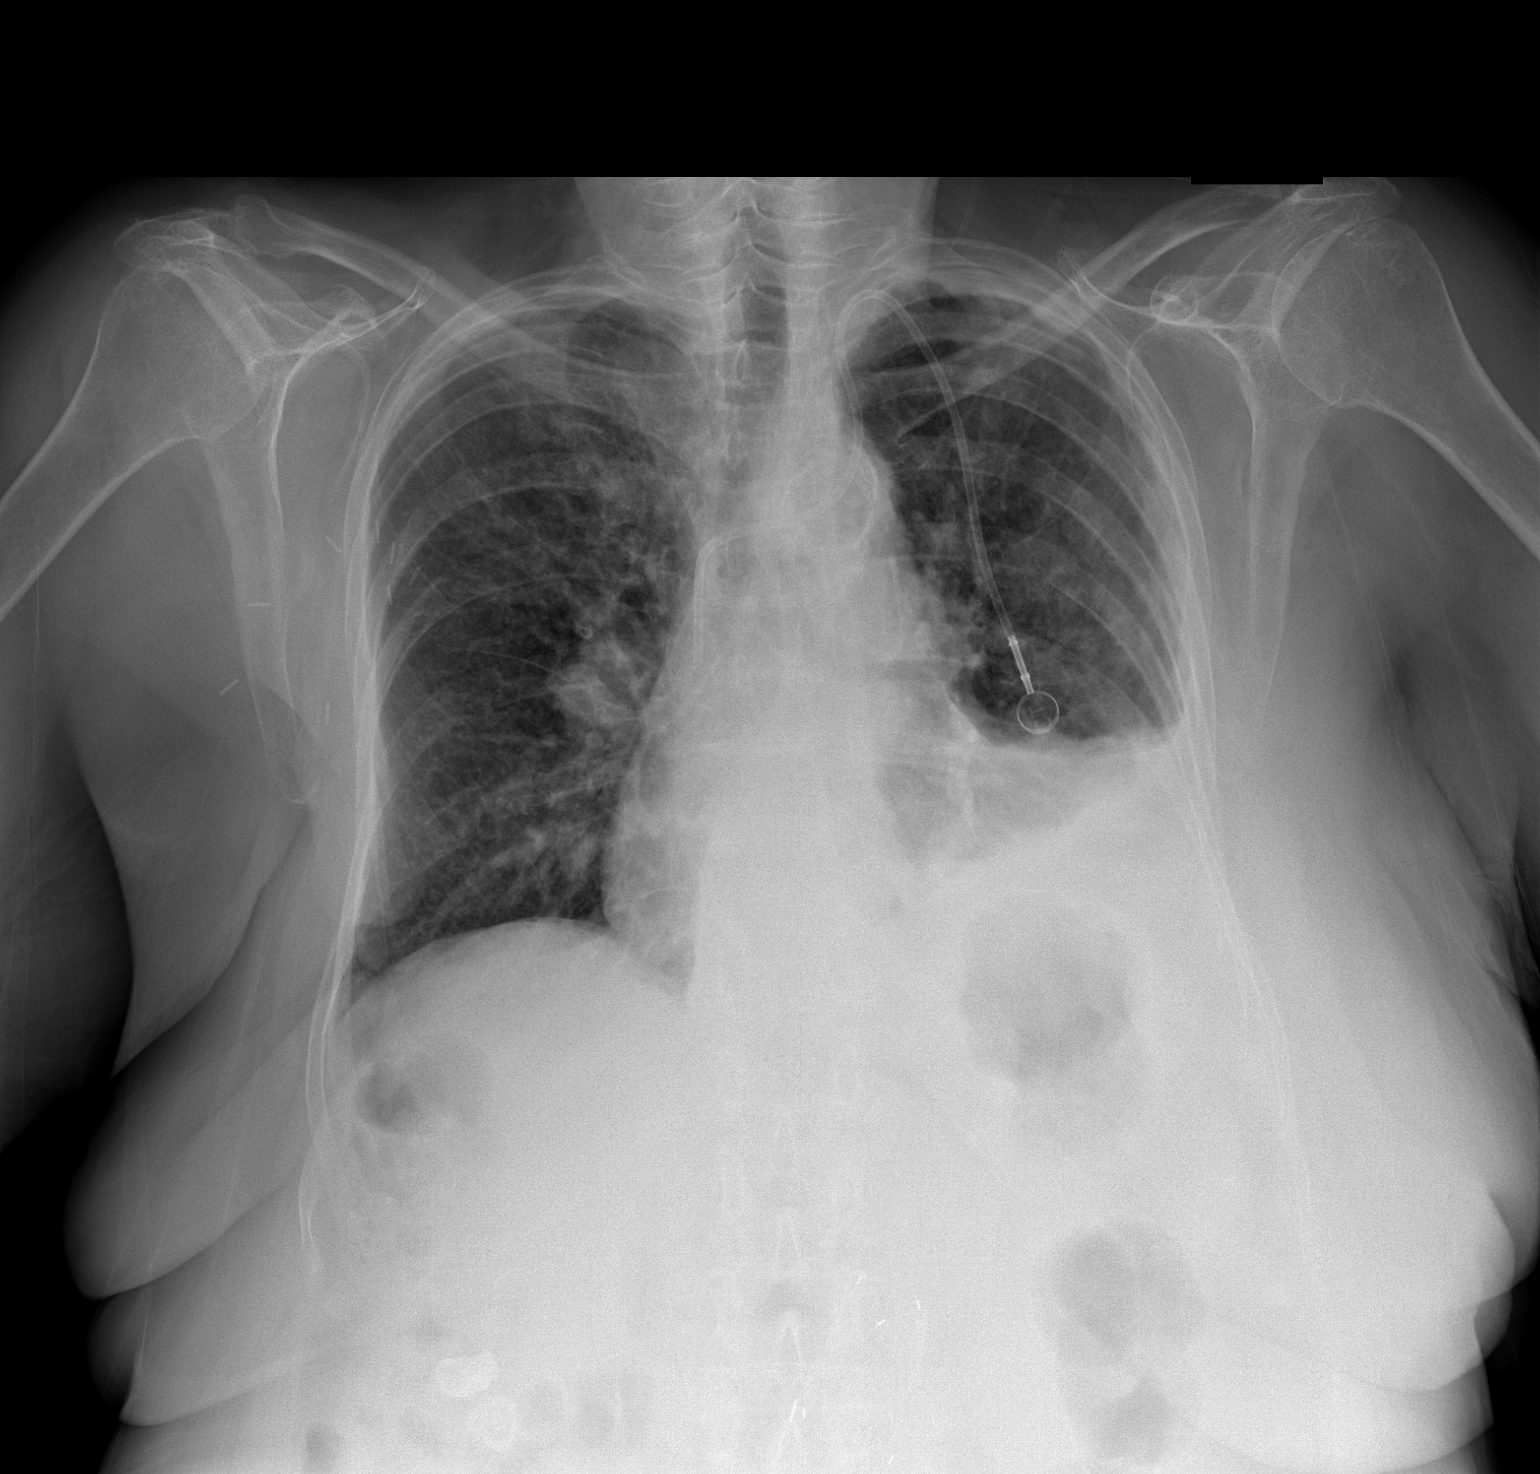

[1 of 1 positions shown; findings below may reference images not displayed]

FINDINGS: Single portable AP chest radiograph is provided. There is a small left
pleural effusion. There is no other focal parenchymal opacity, left pleural
effusion, or pneumothorax. Normal cardiomediastinal silhouette. There is a
left-sided Port-A-Cath present. The osseous structures are unremarkable.
IMPRESSION: No left pneumothorax.

[REDACTED]

## 2014-11-07 IMAGING — CR DG CHEST 1V PORT
1 series · 1 of 1 positions shown · non-contrast
Comparison: none

REASON FOR EXAM: Shortness of Breath
COMMENTS:

[ap]
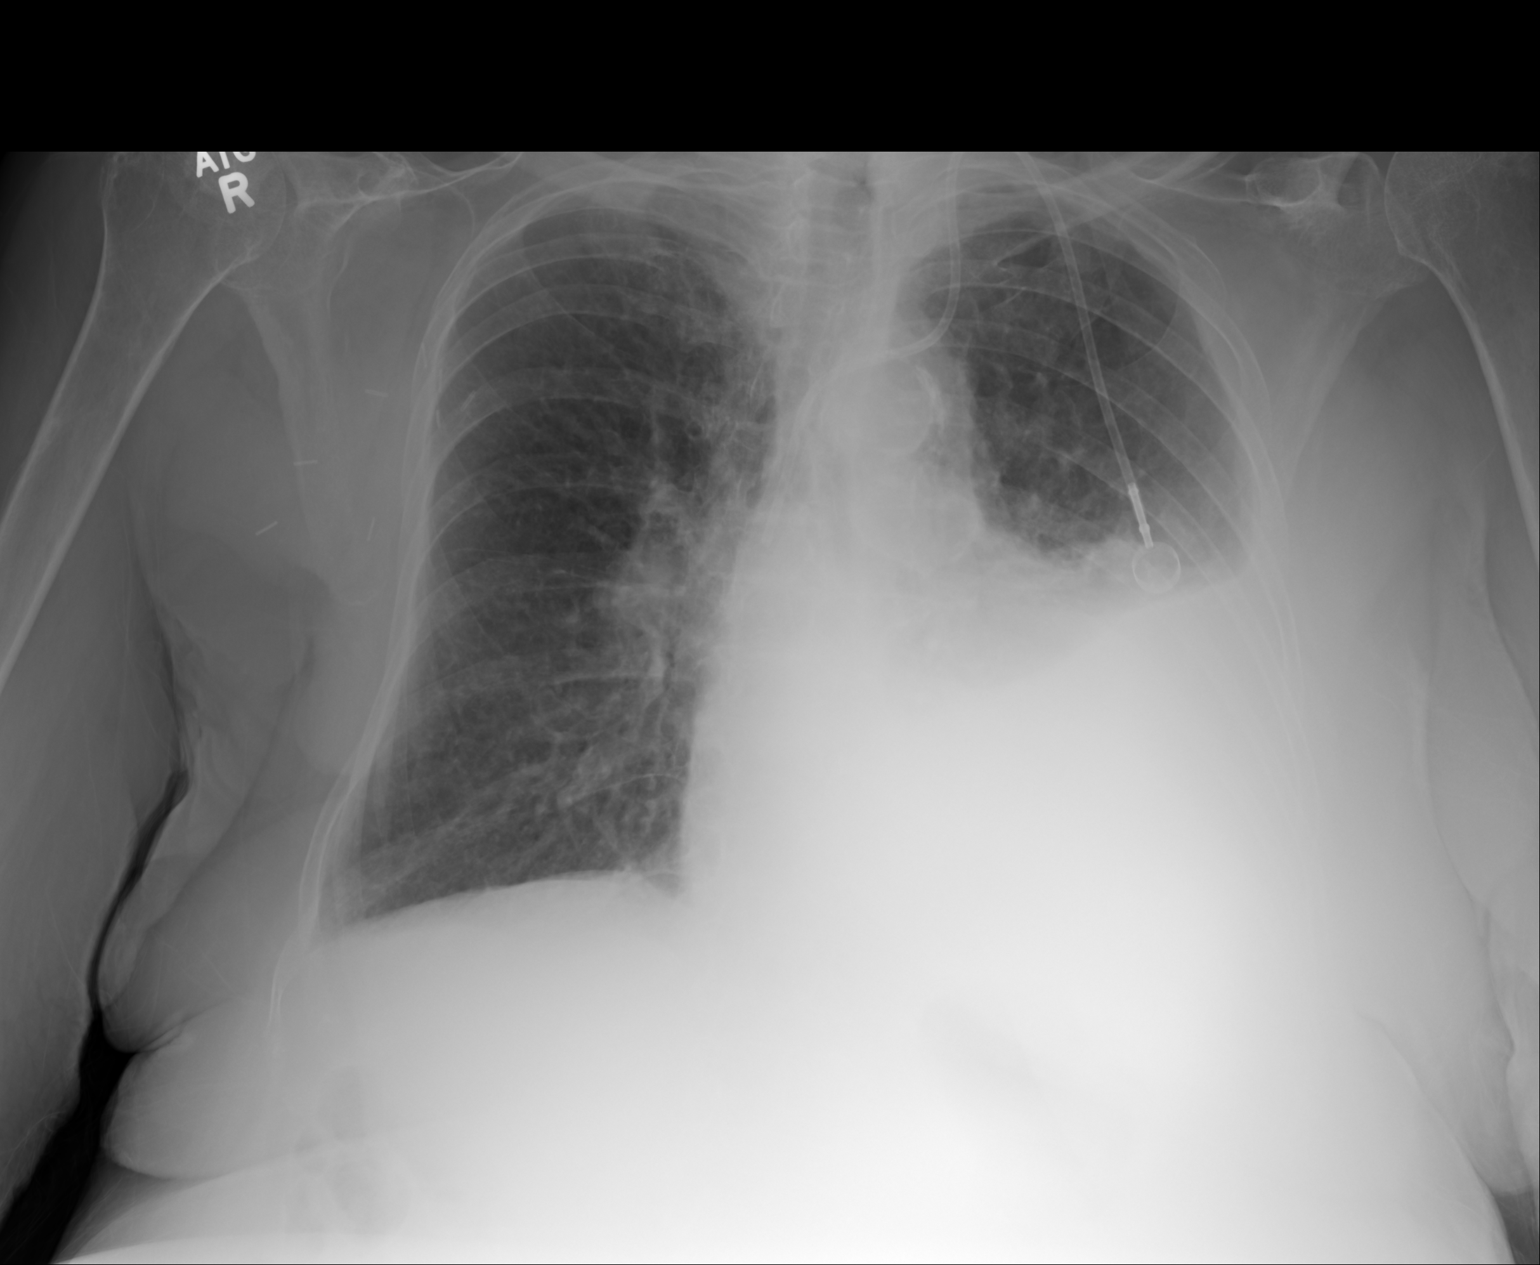

[1 of 1 positions shown; findings below may reference images not displayed]

PROCEDURE:     DXR - DXR PORTABLE CHEST SINGLE VIEW  - June 02, 2013 [DATE]

RESULT:     Comparison is made to the study May 06, 2011.

The right lung is adequately inflated. There is no focal infiltrate. On the
left the lower one half of the hemithorax is opacified presumably with
pleural fluid. There is a Port-A-Cath appliance in place with the tip the
catheter in the region of the proximal SVC. The right heart border appears
normal. The central pulmonary vascularity is prominent.
IMPRESSION: 1. There is a moderate sized left pleural effusion. I cannot exclude
underlying atelectasis or mass. This has increased since the April 15, 2011
study.
2. The right lung exhibits no abnormal masses or pleural fluid.
3. Mild prominence of the central pulmonary vascularity may reflect mild CHF
in the appropriate clinical setting.

[REDACTED]

## 2014-11-09 IMAGING — CR DG CHEST POST BIOPSY - THORACENTESIS
1 series · 1 of 1 positions shown · non-contrast
Comparison: none

REASON FOR EXAM: post thoracentesis
COMMENTS:

PROCEDURE:     DXR - DXR CHEST 1 VIEW QUIRIJN AMAZIGH  - June 04, 2013  [DATE]
RESULT:     Comparison: 05/09/2013

[x chest ap]
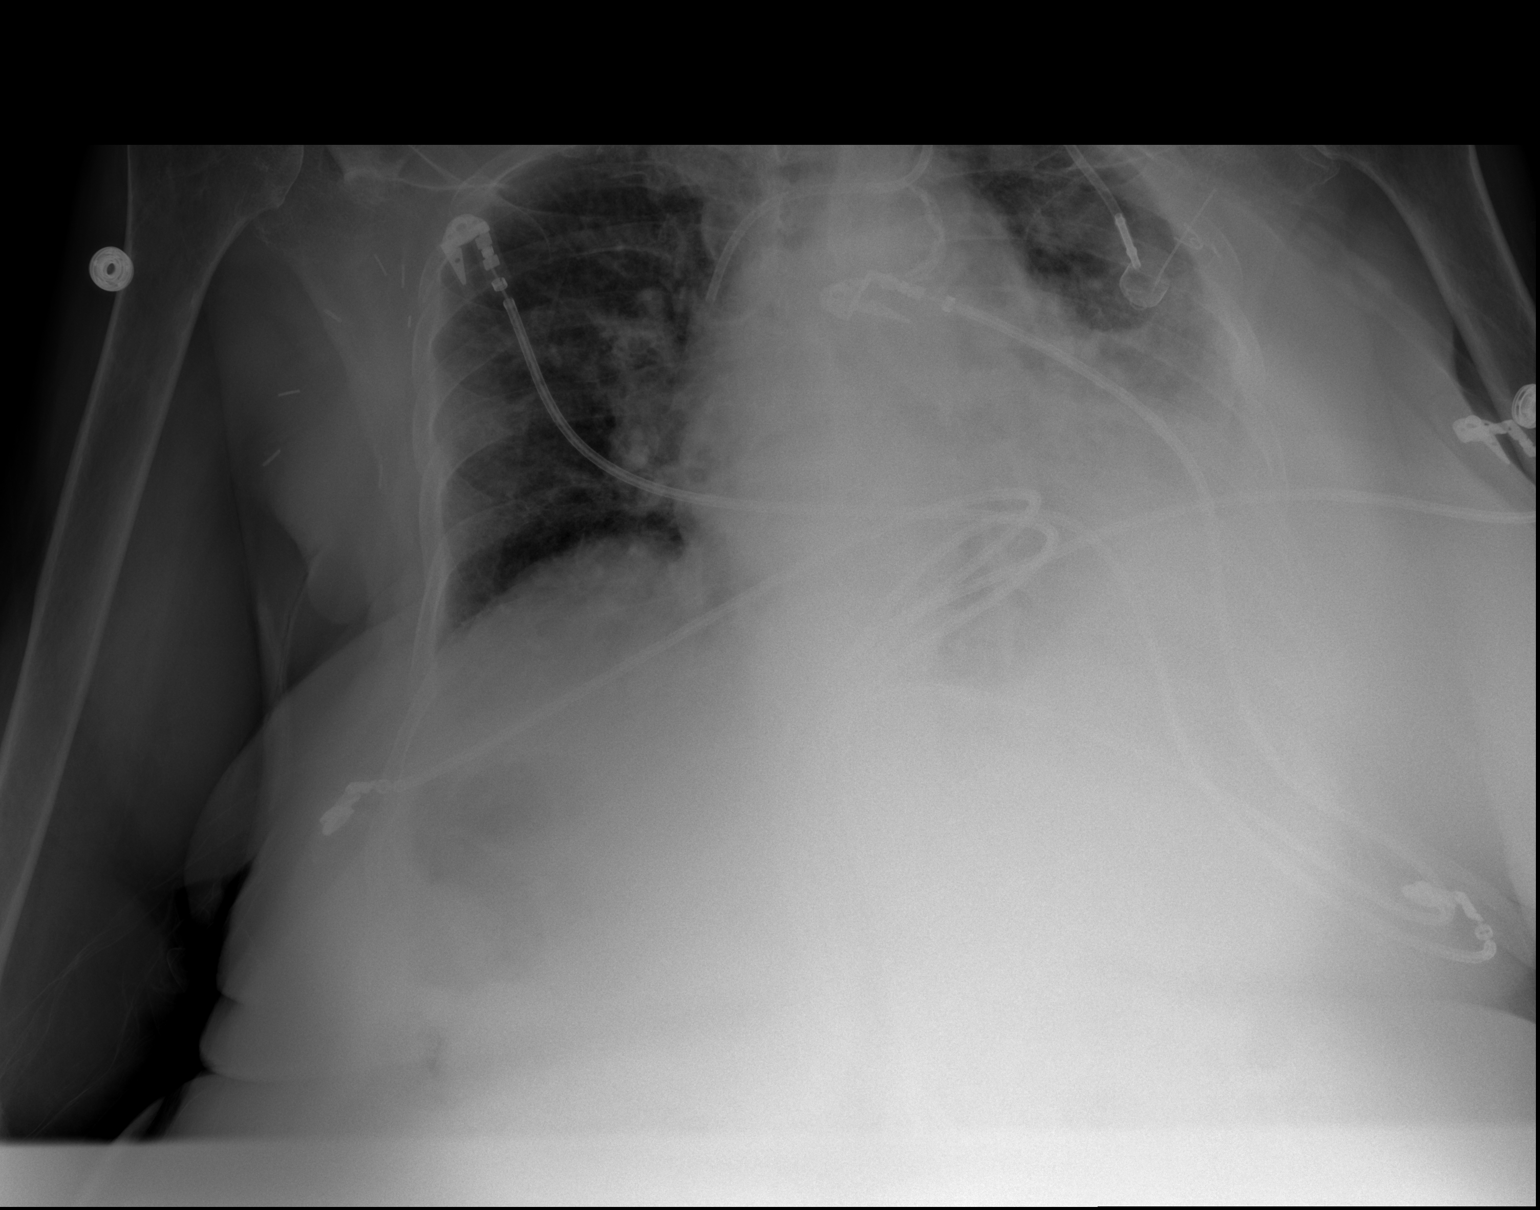

[1 of 1 positions shown; findings below may reference images not displayed]

FINDINGS: The heart and mediastinum are stable. Left pleural effusion is decreased,
with likely small residual left pleural effusion. No pneumothorax seen.
Interstitial opacities are likely secondary to atelectasis from the
expiratory phase of imaging.
IMPRESSION: No pneumothorax, status post left thoracentesis.

## 2014-11-16 IMAGING — CR DG CHEST 1V PORT
1 series · 1 of 1 positions shown · non-contrast
Comparison: none

REASON FOR EXAM: Assess for Pleural Effusion
COMMENTS:

PROCEDURE:     DXR - DXR PORTABLE CHEST SINGLE VIEW  - June 11, 2013  [DATE]
RESULT:
Comparison is made to a prior study dated 06/10/2013.

[ap]
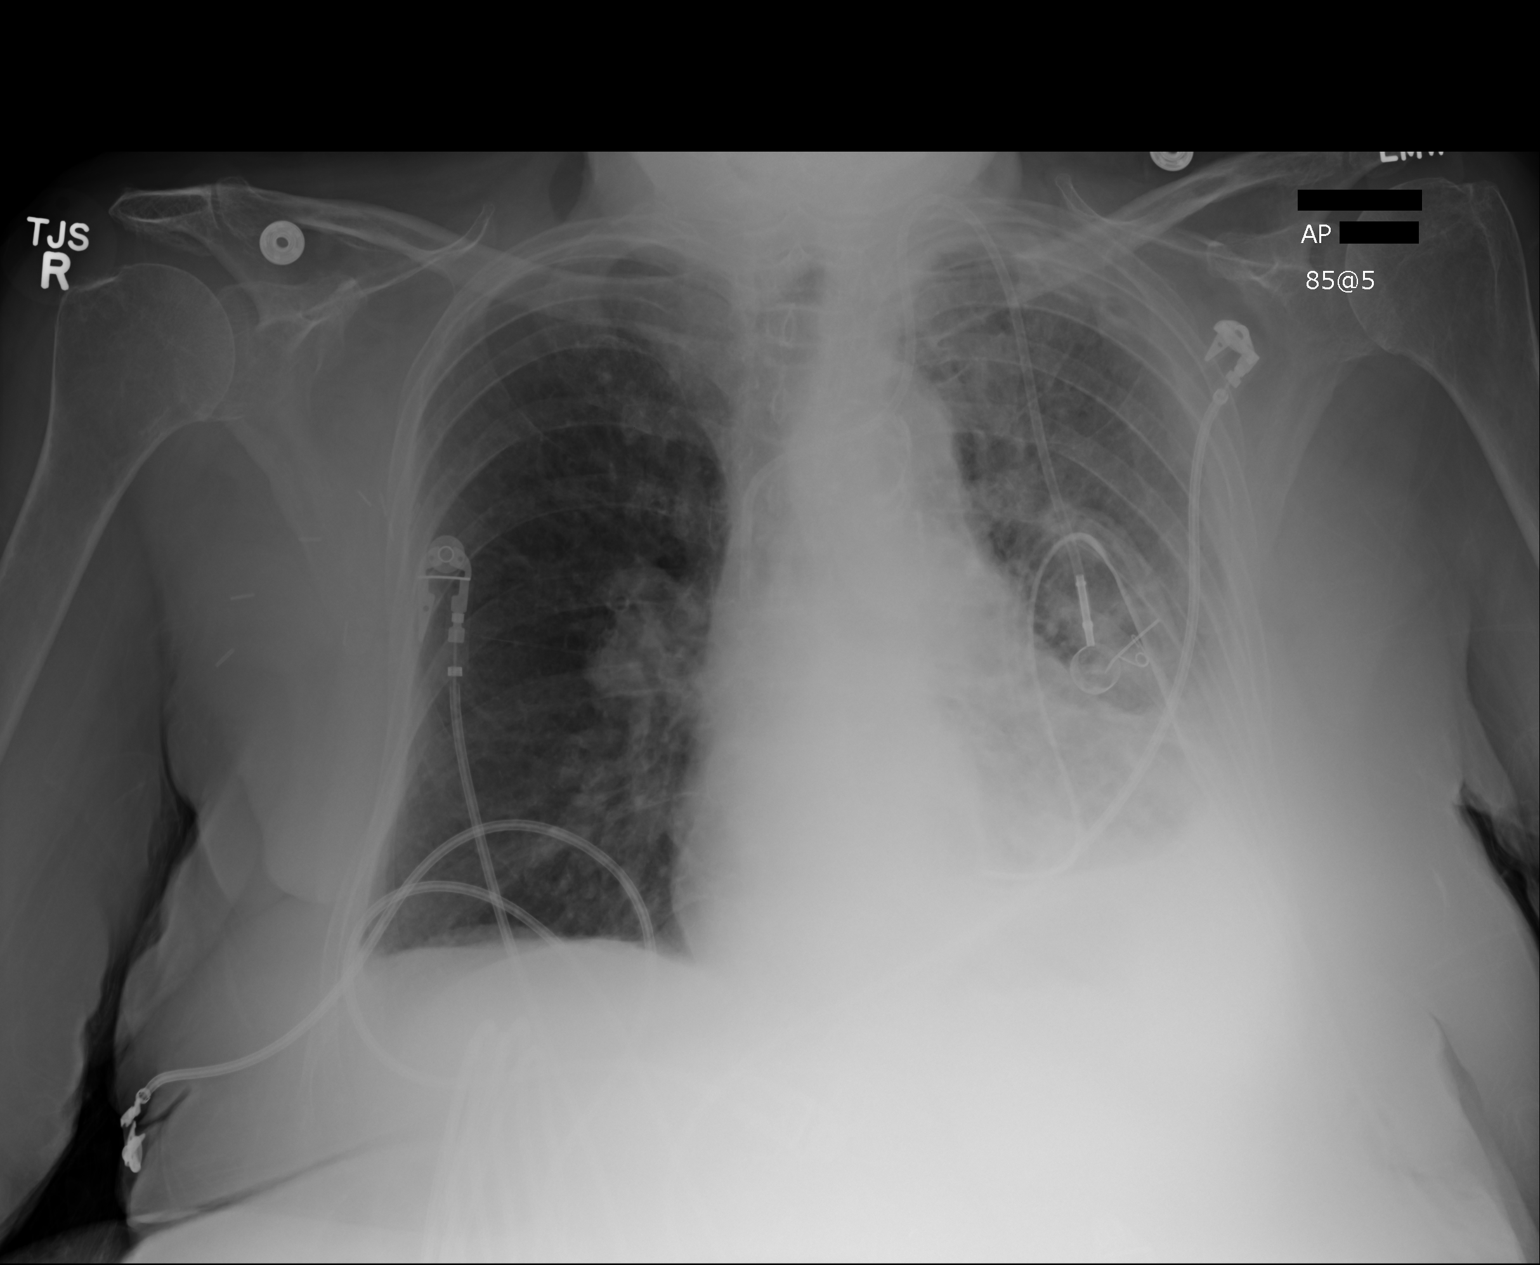

[1 of 1 positions shown; findings below may reference images not displayed]

FINDINGS: Consolidative density with meniscal apex is identified within the
left lung base. There are diffuse pulmonary opacities within the left
hemithorax and volume loss. A left sided Port-A-Cath is appreciated with the
tip projecting in the region of the superior vena cava. A left sided chest
tube is appreciated with the tip projecting in the region of the left lung
base. There is no evidence of an appreciable pneumothorax. The cardiac
silhouette is partially obscured. The right hemithorax is grossly
unremarkable.
IMPRESSION: 1.  Findings likely representing a small effusion, left lung base.
2.  Interstitial infiltrate within the left hemithorax. Differential
considerations are infectious or inflammatory infiltrate versus a component
of pulmonary edema. Clinical correlation recommended.
3.  Findings likely reflect a small, left pleural effusion.

## 2014-11-24 IMAGING — CR DG CHEST 2V
1 series · 2 of 2 positions shown · non-contrast
Comparison: none

REASON FOR EXAM: pneumothorax post thorascopy
COMMENTS:

[Series 2: w chest pa · 0.14mm/px · 2 of 2 slices shown]
[im 1/2]
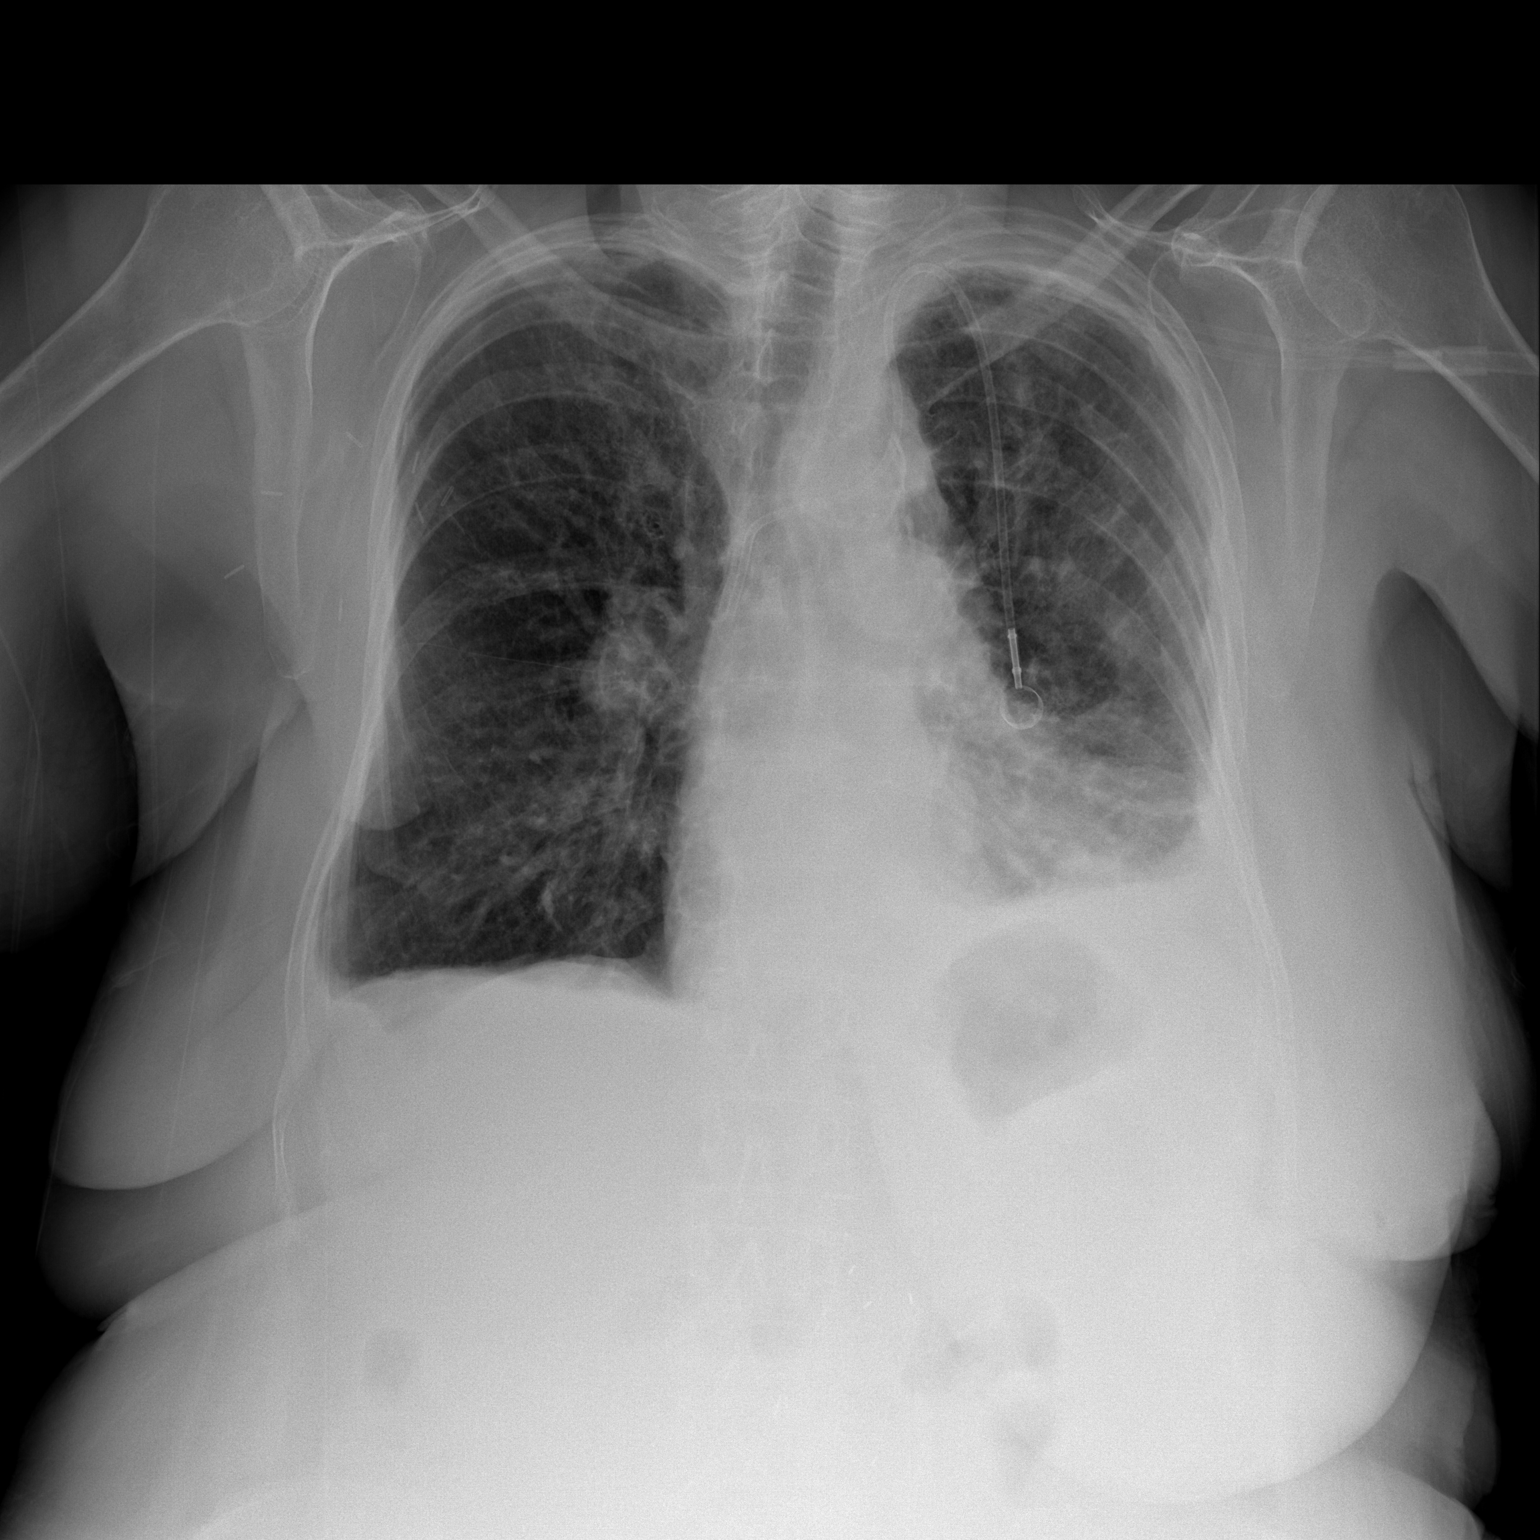
[im 2/2]
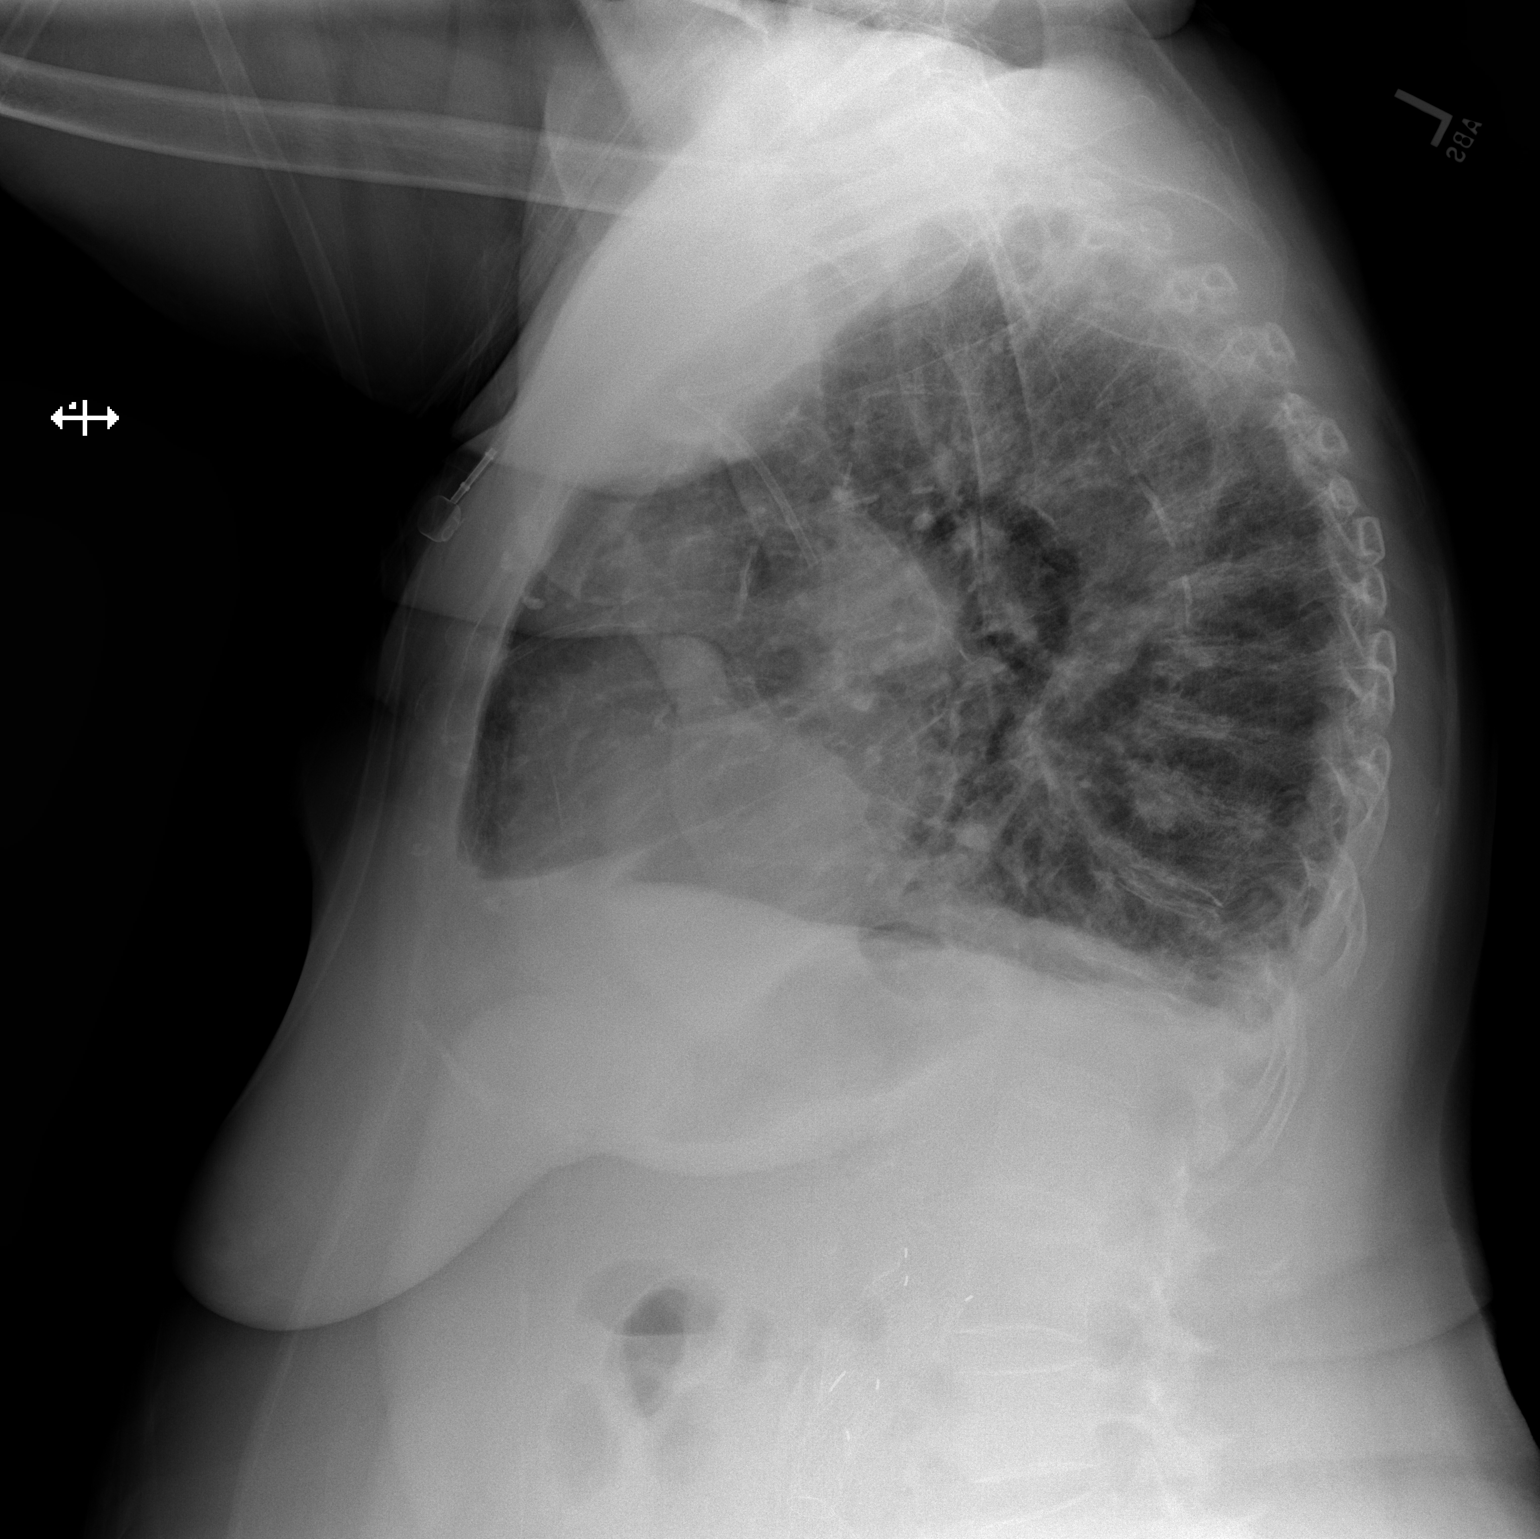

[2 of 2 positions shown; findings below may reference images not displayed]

PROCEDURE:     DXR - DXR CHEST PA (OR AP) AND LATERAL  - June 19, 2013 [DATE]

RESULT:     Comparison is made to the study June 12, 2013.

There is no evidence of a post procedure pneumothorax on the left. There is
volume loss which is not new. A Port-A-Cath appliance is in place with the
tip the catheter in the region of the proximal SVC. The right lung is mildly
hyperinflated and exhibits no pneumothorax. There is mild blunting of the
lateral and posterior costophrenic angles bilaterally. The cardiac
silhouette is not enlarged. There is prominence of the right hilar soft
tissues.
IMPRESSION: There is no evidence of a post procedure complication
following thorascopy.

[REDACTED]

## 2015-03-05 NOTE — H&P (Signed)
PATIENT NAME:  Courtney Chang, Courtney Chang MR#:  [id number] DATE OF BIRTH:  January 16, 1926  DATE OF ADMISSION:  06/25/2013  PRIMARY CARE PHYSICIAN:  Dr. Hall Busing  REFERRING PHYSICIAN:  Dr. Thomasene Lot   CHIEF COMPLAINT: Shortness of breath, cough, worsening leg edema for one week.   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 The patient is alert and awake, in no acute distress. According to the patient, the patient's son and daughter, the patient had right breast cancer, which was diagnosed two years ago and underwent chemotherapy and radiation therapy, recently developed a pleural effusion and got several times thoracentesis. Her last admission was 12 days ago for pleural effusion, possible from metastasis of right breast cancer. The patient was discharged to a rehab. She was fine until one week ago. The patient started to have shortness of breath, cough with worsening leg edema. The patient has a history of chronic lymphoedema   of the right arm, but she has had worsening bilateral leg edema for the past one week. The patient cannot walk, lying on the bed and had respiratory distress last night, so she was sent to ED for further evaluation. The patient was on home oxygen, 2 to 3 liters by nasal cannula, but still has shortness of breath. The patient was noted to have tachycardia at 140 in the ED. Chest x-ray showed pleural effusion.   PAST MEDICAL HISTORY: Hypertension, hyperlipidemia, osteoarthritis, chronic obstructive pulmonary disease, degenerative disk disease, recurrent stage IV adenocarcinoma of the right breast, congestive heart failure, a history of neuropathy.   PAST SURGICAL HISTORY: Status post excision of basal cell cancer from the right toe,  bilateral corneal transplant, knee replacement,  right mastectomy and AAA repair   ALLERGIES:  PENICILLIN, PREDNISONE, PREMARIN, ADHESIVE SOCIAL HISTORY: Smoking less than 1 pack a day and no alcohol drinking or illicit drugs.   FAMILY HISTORY: Hypertension.   HOME MEDICATIONS:  1.  Zofran 4 mg p.o. q. 6 hours p.r.n.  2.  Vitamin C 500 mg p.o. daily.  3.  Tylenol 650 mg p.o. every 6 hours p.r.n.   4.  Trazodone 50 mg p.o. at bedtime.  5.  Symbicort 160 mcg/4.5 mcg inhalation aerosol 2 puffs twice a day.  6.  Spiriva 18 mcg inhalation inhaled once a day.  7.  Soma 3, 50 mg p.o. 3 times a day p.r.n.  8.  Singulair 10 mg p.o. at bedtime.  9.  Senokot 50 mg/8.6 mg 1 tab twice a day.  10.  Pantoprazole 40 mg p.o. once a day.  11.  Ocuvite Antioxidant.  12.  Multivitamin.  13.  Mineral oil p.o. capsule 1 cap once a day.  14. Norco 325 mg/5 mg p.o. 1 tab every 4 hours.  15.  Lopressor ER 50 mg p.o. 0.5 tablet once a day.  14.  Lisinopril 20 mg p.o. daily.  16. Levothyroxine 50 mcg p.o. daily.  17.  Lasix 20 mg p.o. daily.  18.  DuoNeb 2.5 mg/0.5 mg, 3 mL inhaled every 4 hours p.r.n.  19.  Crestor 10 mg p.o. once a day.  20.  Cipro 500 mg p.o. b.i.d.  21.  Ativan 0.5 mg p.o. tablets every 6 hours.  22. Albuterol CFC free 19 mcg inhalation aerosol 2 puffs every 4 hours p.r.n.   REVIEW OF SYSTEMS:  CONSTITUTIONAL: The  patient denies any fever or chills, but has generalized weakness and weight gain.  EYES: No double or blurry vision.   ENT: No postnasal drip, slurred speech or dysphagia.   CARDIOVASCULAR: No chest pain, palpitation, orthopnea, nocturnal dyspnea, but has bilateral leg edema.  PULMONARY: Positive for cough, sputum, shortness of breath. No hemoptysis.  GASTROINTESTINAL: No abdominal pain, nausea, vomiting or diarrhea. No melena or bloody stool.  GENITOURINARY: No dysuria, hematuria or incontinence, but the patient said that she had a urinary tract infection 3 days ago.  SKIN: No rash or jaundice.  HEMATOLOGY: No easy  bruising or bleeding.  ENDOCRINE: No polyuria, polydipsia, heat or cold intolerance.  NEUROLOGY: No syncope, loss of consciousness or seizure.   PHYSICAL EXAMINATION: VITAL SIGNS: Temperature 98.3, blood pressure 98/51, pulse 136, respirations 26, oxygen saturation was 76% on oxygen, now 94% on oxygen.  GENERAL: The patient is alert, awake, oriented, in mild respiratory distress.  HEENT: Pupils round, equal and reactive to light and accommodation. Moist oral mucosa.  Oropharynx is clear.  NECK: Supple. No JVD or carotid bruit. No lymphadenopathy. No thyromegaly.  CARDIOVASCULAR: S1, S2. Regular rate, tachycardia. No murmurs or gallops.  PULMONARY: Bilateral air entry. Bilateral mild crackles. The left side, weak breath sounds.  No use of accessory muscles to breathe. No wheezing.  ABDOMEN: Obese, soft. No distention or tenderness. No organomegaly. Bowel sounds present.  EXTREMITIES: Bilateral lower extremity edema 3+, no tenderness. No clubbing or cyanosis. Difficult to estimate the patient's pedal pulses due to edema.  SKIN: No rash or jaundice.  NEUROLOGY: A and O x 3. No focal deficit. Power 3 to 4/5. Sensation intact.   LABORATORY, DIAGNOSTIC AND RADIOLOGIC DATA: Ultrasound color flow duplex of bilateral lower extremities showed no evidence of deep venous thrombosis.   Glucose 97, BUN 19, creatinine 0.78. Electrolytes are normal. WBC 12.8, hemoglobin 8.5, platelets 344, troponin less than 0.02. Urinalysis is negative.  BNP is 738.   Chest x-ray showed left lower hemithorax consistent with presence of fluid and atelectasis with interstitial markings of both lungs, consistent with low degree of congestive heart failure.   EKG shows sinus tachycardia at 136 BPM.   IMPRESSIONS: 1.  Acute on chronic respiratory failure.  2.  Pleural effusions.  3.  Tachycardia.  4.  Questionable congestive heart failure.  5.  History of stage IV right breast cancer.  6.  Hypertension, but the blood  pressure is low now.  7.  Chronic obstructive pulmonary disease.  8.  Neutropenia, possibly due to chemotherapy 9.  Anemia.  10.  Tobacco abuse.   PLAN OF TREATMENT: 1.  The patient will be admitted to the medical floor. We will continue O2 by nasal cannula, 3 to 4 liters and we will give Xopenex.  2.  Continue Symbicort, Spiriva, Singulair and we will get a pulmonary consult and a thoracic surgery consult for possible thoracentesis.  3.  For leg edema, we will get an echocardiogram to evaluate the patient's cardiac function and give Lasix 20 mg IV if the patient's blood pressure is not less than 100/60.  4.  For metastatic breast cancer, we will get an oncology and palliative care consults.  5.  For tobacco abuse, smoking cessation, consulted for 5 minutes.  6. Discussed the patient's condition and plan of treatment with the patient, patient's son, daughter and grandson. They want the patient full code now.   TIME SPENT: About 65 minutes.    ____________________________ Demetrios Loll, MD qc:cc D: 06/25/2013 15:04:00  ET T: 06/25/2013 15:43:53 ET JOB#: [id number]  cc: Demetrios Loll, MD, <Dictator> Demetrios Loll MD ELECTRONICALLY SIGNED 06/27/2013 16:42

## 2015-03-05 NOTE — Discharge Summary (Signed)
PATIENT NAME:  Courtney Chang, MCCONAUGHEY MR#:  [id number] DATE OF BIRTH:  1925/12/03  For a detailed note, please take a look at the history and physical done on admission by Dr. Bridgett Larsson.   DIAGNOSES AT DISCHARGE: As follows: acute-on-chronic respiratory failure secondary to chronic obstructive pulmonary disease and metastatic breast cancer, chronic obstructive pulmonary disease exacerbation, acute-on-chronic diastolic congestive heart failure, recurrent breast cancer with metastatic disease to the lung, hyperlipidemia, hypothyroidism, chronic lower extremity peripheral edema.   DIET: Low-sodium, mechanical soft diet.   ACTIVITY: As tolerated.   FOLLOW-UP: Dr. Benita Stabile in the next 1 to 2 weeks.   DISCHARGE MEDICATIONS:  Symbicort 160/4.5, two puffs b.i.d., lisinopril 20 mg daily, vitamin C 500 mg daily, Synthroid 50 mcg daily, Singulair 10 mg daily, Lasix 20 mg daily, metoprolol succinate 50 mg half a tab daily, Protonix 40 mg daily, multivitamin daily, Crestor 10 mg daily, Senokot 1 tab b.i.d., Zofran 4 mg q.6 hours as needed, Ativan 0.5 mg q.6 hours, Norco 5/325 one tab q.4 hours, trazodone 50 mg at bedtime, Spiriva 1 puff daily, Soma 350 mg one tab t.i.d., albuterol inhaler 2 puffs every 4 hours as needed, Tylenol 650 q.4 hours as needed, Lovaza 1 g daily, DuoNebs q.4 hours  as needed, Levaquin 750 q.48 hours for five doses, prednisone taper starting at 60 mg to 10 mg over the next six days, and Roxanol 0.5 mL to one mL every 1 to two hours as needed for pain and air hunger.   CONSULTANTS DURING THE HOSPITAL COURSE: Dr. Raul Del from pulmonary critical care and Dr. Delight Hoh from hematology/oncology.   PERTINENT STUDIES DONE DURING THE HOSPITAL COURSE: A CT of the chest done on August 15 with contrast showing no pulmonary embolism, significant diffuse pleural thickening in the left hemithorax with resolution of the large left fluid collection, appears to be changes of the left lower lobe pneumonia and  possibly early right lower lobe pneumonia.   HOSPITAL COURSE: This is an 81 year old female with medical problems as mentioned above, who presented to the hospital on June 25, 2013 secondary to acute shortness of breath and tachycardia.  1.  Acute-on-chronic respiratory failure. Initially on admission, the patient presented hypoxic and short of breath and tachycardic, and it was thought that the patient had a recurrence of her pleural effusion as she has a history of metastatic breast cancer. She was empirically started on broad-spectrum IV antibiotics to cover for a possible postobstructive pneumonia with a parapneumonic effusion. After obtaining a CT chest, the pleural effusion was ruled out as it showed resolution of it. It did show possible bilateral multifocal pneumonia. The patient was initially started on just Levaquin and meropenem was added to her treatment. She was also treated aggressively with IV steroids, around-the-clock nebulizer treatments, and maintained on her Symbicort and Spiriva. The patient's bronchospasm and shortness of breath has significantly improved since admission. She now is clinically feeling much better and is therefore being discharged home. She is already  on chronic home oxygen which she will continue along with prednisone taper and Levaquin as stated. The patient also may have had some mild case of congestive heart failure causing her shortness of breath. She was also diuresed with IV Lasix and her peripheral edema since then has significantly improved.  2.  Congestive heart failure. This is probably acute-on-chronic diastolic dysfunction and also probably related to hypoalbuminemia. The patient was aggressively diuresed with IV Lasix. Her lower extremity edema and shortness of breath has significantly improved. She  will continue her home dose of Lasix as mentioned. She did have an echocardiogram done, which showed ejection fraction of 50% to 55%, consistent with diastolic  dysfunction.  3.  Sinus tachycardia. The patient remains significantly tachycardic throughout the hospital course. This is probably related to her underlying chronic lung disease, her chronic obstructive pulmonary disease, and her metastatic breast cancer. She did have a CT chest which showed no evidence of pulmonary embolism.  4.  Bilateral lower extremity edema. Given her history of malignancy, there was some concern for possible deep vein thrombosis. She underwent Dopplers of her lower extremities which were negative for deep vein thrombosis; therefore, her lower extremity edema was probably related to hypoalbuminemia and poor mobility. She was diuresed with IV Lasix and this has significantly improved since admission.  5.  Chronic anemia. This is probably anemia of chronic disease from her underlying malignancy. The patient's hemoglobin remained stable and she did not require any transfusion.  6.  Hyperlipidemia. The patient was maintained on her Crestor. She will resume that.  7.  Hypothyroidism. The patient was maintained on her Synthroid and she will also resume that.   Given her multiple comorbidities and poor functional status, a palliative care consult was obtained to discuss goals of care with the family. After discussion with palliative care and also with Dr. Grayland Ormond, the patient's oncologist, the patient was made a DNR, and patient and patient's son have now agreed to home with hospice services which is where she is currently being discharged.   CODE STATUS: DNR.  TIME SPENT ON DISCHARGE: 40 minutes.    ____________________________ Belia Heman. Verdell Carmine, MD vjs:np D: 07/03/2013 15:21:02 ET T: 07/03/2013 21:21:14 ET JOB#: [id number]  cc: Belia Heman. Verdell Carmine, MD, <Dictator> Leona Carry. Hall Busing, MD  Henreitta Leber MD ELECTRONICALLY SIGNED 07/07/2013 13:40

## 2015-03-05 NOTE — Consult Note (Signed)
PATIENT NAME:  Courtney Chang, Courtney Chang MR#:  [id number] DATE OF BIRTH:  1926-02-10  DATE OF CONSULTATION:  06/06/2013  REFERRING PHYSICIAN:  Delight Hoh, MD CONSULTING PHYSICIAN:  Lew Dawes. Sareen Randon, MD  REASON FOR CONSULTATION:  Management of malignant pleural effusion.   I have personally seen and examined Mrs. Courtney Chang. I discussed her care extensively with Dr. Grayland Ormond on 2 separate occasions. I have spent over an hour with the patient in direct face-to-face contact and an additional hour in reviewing her chart, medical records, laboratory studies and x-rays. A total time of 2 hours was taken for this consultation.   HISTORY OF PRESENT ILLNESS: Ms. Courtney Chang is an 81 year old white female with a history of a breast cancer which was diagnosed several years ago. In addition, she has a history of congestive heart failure and was admitted to the hospital with increasing shortness of breath. She had a chest x-ray made which revealed a pleural effusion and she underwent a thoracentesis, which confirmed the diagnosis of a malignant pleural effusion. She has had 2 successful thoracenteses over the last month or so. Both times she has felt that she has improved after the thoracentesis. She was readmitted to the hospital here with increasing shortness of breath and another thoracentesis was performed. The second specimen was not sent for cytology since the first one revealed an adenocarcinoma. I am asked to see the patient today for management of her malignant pleural effusion.   PAST MEDICAL HISTORY: Significant for the breast cancer as outlined above. In addition, she is a lifelong smoker. She has a history of heart failure as well with an ejection fraction of 40%, which is followed by Dr. Nehemiah Massed. She has a history of hypertension, hyperlipidemia, COPD.  She has had bilateral corneal transplants, knee surgery and abdominal aortic aneurysm repair by Dr. Leotis Pain and a right mastectomy.   PHYSICAL  EXAMINATION:  GENERAL: She is an elderly woman who appears her stated age of 65 years. She is able to speak in complete sentences without significant dyspnea.  LUNGS: Showed slightly diminished breath sounds at the left base.  HEART: Was regular.  ABDOMEN: Was soft, nontender and nondistended.  EXTREMITIES: Revealed no clubbing, cyanosis. She did have 1+ pedal edema.   ASSESSMENT AND PLAN: I have independently reviewed her chest x-rays and CT scans. She does have a recurrent left pleural effusion.   I had a very long discussion with her and her son for over an hour today. We discussed all the options, which would include repeated thoracentesis, image guided pigtail catheter placement with doxycycline pleurodesis, Pleurx catheter drainage, chest tube insertion with talc slurry and finally thoracoscopy, possible thoracotomy with talc pleurodesis.  I explained to her the indications and risks, the advantages and disadvantages of all these different techniques. At the completion of the procedure, the patient had an opportunity to have all of her questions answered. After an extensive discussion, they would like to proceed on with a thoracoscopic approach. I explained to them the indications and risks including the risks of bleeding, infection, recurrence and death.   They would like to discuss her cardiac management with Dr. Nehemiah Massed. If Dr. Nehemiah Massed feels that her risks are reasonable, we will go ahead and proceed. If her risks are too high and would prohibit general anesthesia, then we will make alternative plans.   Thank you very much for allowing me to participate in her care today.  ____________________________ Lew Dawes. Genevive Bi, MD teo:sb D: 06/06/2013 13:27:35 ET T:  06/06/2013 14:05:20 ET JOB#: [id number]  cc: Christia Reading E. Genevive Bi, MD, <Dictator> Louis Matte MD ELECTRONICALLY SIGNED 06/12/2013 8:47

## 2015-03-05 NOTE — Consult Note (Signed)
   Comments   Dr Phifer and I had a lengthy meeting with patient and her son. Updated son on pt's medical status. Son remains very hopeful for a recovery. Does not want her to go to rehab due to risk of infections. Says he will take her home. We all discussed code status together and son agrees that pt should have autonomy to make her decisions. Contrary to what patient told me this morning, in son's presence, she says "make a resonable attempt to bring me back, like one compression". Son asks about leaving her on a ventilator a month. Explained that given pt's terminal cancer, attempts at resuscitation were likely futile and chance at a meaningful recovery would be slim. Pt then indicated she would not want resucitation. Family to discuss together and will let RN know of a decision.  45 minutes  Electronic Signatures for Addendum Section:  Phifer, Izora Gala (MD) (Signed Addendum 30-Jul-14 19:31)  Billey Chang, NP, and I met with pt and her son. Pt seems conflicted over code status and son seems determined for pt to be a full code. No change in code status.   Electronic Signatures: Borders, Kirt Boys (NP)  (Signed 30-Jul-14 15:43)  Authored: Palliative Care   Last Updated: 30-Jul-14 19:31 by Phifer, Izora Gala (MD)

## 2015-03-05 NOTE — H&P (Signed)
PATIENT NAME:  Courtney Chang, Courtney Chang MR#:  [id number] DATE OF BIRTH:  [birth year]  DATE OF ADMISSION:  06/02/2013  PRIMARY CARE PROVIDER: Dr. Benita Stabile ONCOLOGIST: Dr. Grayland Ormond ED REFERRING PHYSICIAN: Dr. Corky Downs.   CHIEF COMPLAINT: Shortness of breath, cough.   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 Her sats in the ED dropped to as low as 81 on room air. She denies any fevers or chills and denies any cough. She does not have any chest pains or palpitations. When she arrived in the ED, her heart rate was 130s, now heart rate is 88. The patient otherwise denies any nocturnal dyspnea or orthopnea. She is not on any oxygen. She denies any abdominal pain, nausea, vomiting or diarrhea.   PAST MEDICAL HISTORY: Significant for: 1.  Hypertension.  2.  Hyperlipidemia.  3.  Osteoarthritis.  4.  COPD.  5.  Degenerative disk disease.  6.  Recurrent stage IV adenocarcinoma of the right breast.  7.  Listed as having CHF. Details of this are not available.  8.  Has history of neuropathy.  9.  Osteoarthritis.   PAST SURGICAL HISTORY:  1.  Status post excision of basal cell cancer from the right toe.  2.  Bilateral corneal transplants. 3.  Knee replacement.  4.  Right mastectomy.  5.  AAA repair.  ALLERGIES: PENICILLIN, PREDNISONE, PREMARIN, ADHESIVE.   CURRENT MEDICATIONS: Carisoprodol 350, 0.5 to 1tab p.o. at bedtime, Centrum 1 tab p.o. daily, Compazine 10, 1 tab p.o. q. 6 p.r.n., Crestor 10 daily, fish oil 1000 mg 1 tab p.o. t.i.d., Lasix 20 daily, ibuprofen 200, 1 tab p.o. q. 4 p.r.n., levothyroxine 50 mcg daily, lisinopril 20 daily, lorazepam 0.5 q. 6 p.r.n. for anxiety,  metoprolol succinate 50 mg 1/2 tab p.o. daily, potassium gluconate 1 tab p.o. daily, ProAir 90 mcg inhalation 2 puffs b.i.d., Singulair 10 daily, Symbicort 160/4.5, 2 puffs b.i.d., vitamin B 1 tab p.o. daily, vitamin C 1 tab p.o. daily.   SOCIAL HISTORY: Continues to smoke less than 1 pack per day. No alcohol or drug use.   FAMILY HISTORY: Positive for hypertension.   REVIEW OF SYSTEMS:  CONSTITUTIONAL: Denies any fevers. Complains of fatigue, weakness. No pain. No weight loss. No weight gain.  EYES: No blurred or double vision. No pain. No redness. No inflammation.  ENT: No tinnitus. No ear pain. No hearing loss. No seasonal or year-round allergies. No difficulty swallowing.  RESPIRATORY: Complains of cough, wheezing, shortness of breath, has a history of COPD.  CARDIOVASCULAR: Denies any chest pain, orthopnea, edema. No arrhythmia.  GASTROINTESTINAL: No nausea, vomiting, diarrhea. No abdominal pain. No hematemesis, no melena. No ulcer. No GERD. No IBS. No jaundice.  GENITOURINARY: Denies any dysuria, hematuria, renal calc or frequency.  ENDOCRINE: Denies any polyuria, nocturia or thyroid problems.  HEMATOLOGIC/LYMPHATIC: Denies anemia, easy bruisability or bleeding.  MUSCULOSKELETAL: Has pain related to osteoarthritis.  NEUROLOGICAL: No numbness. No CVA. No transient ischemic attack.  PSYCHIATRIC: No anxiety. No insomnia. No ADD.   PHYSICAL EXAMINATION: VITAL SIGNS: Temperature 98, pulse 88, respirations 18, blood pressure 90/48, O2 95% on room air, with activity was 81%. The patient's pulse was 130, and then it has dropped to 88 now. GENERAL: The patient  is an obese white female in no acute distress.  HEENT: Head atraumatic, normocephalic. Pupils equally round, reactive to light and accommodation. There is no conjunctival pallor. No scleral icterus. Nasal exam shows no drainage or ulceration. Oropharynx is clear without any exudate.  NECK: No thyromegaly. No carotid bruits.  RESPIRATORY:  Good respiratory effort. Occasional wheezing. There is no accessory muscle usage. There are no rales or crackles.  CARDIOVASCULAR: Regular rate and rhythm. No murmurs, gallops, clicks or heaves.  ABDOMEN: Soft, nontender, nondistended. Positive bowel sounds x 4.  GENITOURINARY: Deferred.  MUSCULOSKELETAL: There is no erythema or swelling.  VASCULAR: Good DP, PT pulses.  SKIN: There is no rash.  LYMPHATICS: No lymph nodes palpable.  VASCULAR: Good DP, PT pulses.  NEUROLOGICAL: Cranial nerves II through XII are grossly intact. Strength 5 out of 5.  PSYCHIATRIC: Currently not anxious or depressed.   LABORATORY AND RADIOLOGICAL DATA: Evaluations in the ED: Glucose 100, BUN 8, creatinine 0.62, sodium 131, potassium 3.9, chloride 96, CO2 is 31, calcium 9.3, total protein 6.9, albumin 3.3, bili total 0.3, alk phos is 73, AST is 29, ALT is 25. Troponin less than 0.02.  Chest x-ray shows moderate size left pleural effusion. This is increased since 04/15/2011 study. The right lung exhibits no abnormal masses or pleural fluid. Mild prominence of the central pulmonary vasculature may reflect mild CHF.   ASSESSMENT AND PLAN: The patient is an 81 year old with stage IV adenocarcinoma of the right breast with metastatic disease to the lungs, presents with shortness of breath, wheezing.   1.  Acute respiratory failure: Likely due to acute COPD flare as well as possible pleural effusion. At this time, we will treat her with nebulizers, steroids and Avelox. We will ask Radiology to drain the pleural effusion. Likely malignant in nature.  2.  Elevated heart rate on presentation, now improved: Likely related to #1.  We will follow her heart rate.  3.  Hypotension:  We will give her IV fluids, hold blood pressure medications for now.  4.  Breast cancer:  She requests Dr. Grayland Ormond to see.  We will ask him to come see.  5.  Hyperlipidemia: We will continue Crestor.  6.  Nicotine addiction: The patient counseled  regarding smoking cessation for 4 minutes. Nicotine patch offered. We will start on nicotine patch.        TIME SPENT: 50 minutes spent on this H and P.    ____________________________ Lafonda Mosses. Posey Pronto, MD shp:cb D: 06/02/2013 17:23:42 ET T: 06/02/2013 17:33:43 ET JOB#: [id number]  cc: Brandolyn Shortridge H. Posey Pronto, MD, <Dictator> Alric Seton MD ELECTRONICALLY SIGNED 06/16/2013 9:02

## 2015-03-05 NOTE — Consult Note (Signed)
History of Present Illness:  Reason for Consult Recurrent stage IV breast cancer with malignant pleural effusion, now with increasing shortness of breath and cough.   HPI   Patient last evaluated and received chemotherapy in clinic with [facility] on May 26, 2013.  Patient states since receiving her treatment she became increasingly short of breath with worsening cough.   She also complains of increased weakness and fatigue.  She denies any recent fevers.  She has a poor appetite and denies weight loss.  She has no chest pain.  She denies any nausea, vomiting, constipation, or diarrhea.  She has no urinary complaints.  Patient offers no further specific complaints today.  PFSH:  Additional Past Medical and Surgical History Hypertension, hyperlipidemia, osteoarthritis, COPD, degenerative disc disease.  Excision of basal cell carcinoma from right face, bilateral corneal transplant, knee surgery, right mastectomy, AAA repair.  Family history: Negative and noncontributory  Social history: Positive tobacco one pack per day for greater than 60 years, patient continues to smoke.  She is offered smoking cessation counseling.  She denies alcohol use.   Review of Systems:  Performance Status (ECOG) 2   Review of Systems   As per HPI. Otherwise, 10 point system review was negative.   NURSING NOTES: **Vital Signs.:   22-Jul-14 15:00   Vital Signs Type: Routine   Temperature Temperature (F): 98   Celsius: 36.6   Temperature Source: oral   Pulse Pulse: 121   Respirations Respirations: 22   Systolic BP Systolic BP: 277   Diastolic BP (mmHg) Diastolic BP (mmHg): 58   Mean BP: 78   Pulse Ox % Pulse Ox %: 98   Pulse Ox Activity Level: At rest   Oxygen Delivery: 3L   Physical Exam:  Physical Exam General: ill-appearing, mild respiratory distress Eyes: Pink conjunctiva, anicteric sclera. HEENT: Normocephalic, moist mucous membranes, clear oropharnyx. Lungs: diminished breath sounds  on left, right lung clear to auscultation Heart: Regular rate and rhythm. No rubs, murmurs, or gallops. Abdomen: Soft, nontender, nondistended. No organomegaly noted, normoactive bowel sounds. Musculoskeletal: No edema, cyanosis, or clubbing. Neuro: Alert, answering all questions appropriately. Cranial nerves grossly intact. Skin: No rashes or petechiae noted. Psych: Normal affect.    Penicillin: Anaphylaxis, Itching, Hives  Prednisone: Other  Premarin: Agitation  Adhesive: Blisters    Crestor 10 mg oral tablet: 1 tab(s) orally once a day (in the morning), Status: Active, Quantity: 0, Refills: None   Fish Oil 1000 mg oral capsule: 1 cap(s) orally 3 times a day, Status: Active, Quantity: 0, Refills: None   lisinopril 20 mg tablet: 1 tab(s) orally once a day (in the morning), Status: Active, Quantity: 30, Refills: None   lorazepam 0.5 mg oral tablet: 1 tab(s) orally every 6 hours, As Needed - for Anxiety, Nervousness, Status: Active, Quantity: 0, Refills: None   Vitamin C 500 mg oral tablet: 1 tab(s) orally once a day, Status: Active, Quantity: 0, Refills: None   potassium gluconate 550 mg oral tablet: 1 tab(s) orally once a day, Status: Active, Quantity: 0, Refills: None   Vitamin B Complex 100: 1 tab(s) orally once a day, Status: Active, Quantity: 0, Refills: None   Centrum oral tablet: 1 tab(s) orally once a day, Status: Active, Quantity: 0, Refills: None   levothyroxine 50 mcg (0.05 mg) oral tablet: 1 tab(s) orally once a day, Status: Active, Quantity: 0, Refills: None   Symbicort 160 mcg-4.5 mcg/inh inhalation aerosol: 2 puff(s) inhaled 2 times a day, Status: Active, Quantity: 0, Refills: None  ibuprofen 200 mg oral tablet: 1 tab(s) orally every 4 hours, As Needed - for Pain, Status: Active, Quantity: 0, Refills: None   Singulair 10 mg oral tablet: 1 tab(s) orally once a day (in the evening), Status: Active, Quantity: 0, Refills: None   ProAir HFA CFC free 90 mcg/inh  inhalation aerosol: 2 puff(s) inhaled 2 times a day, Status: Active, Quantity: 0, Refills: None   furosemide 20 mg oral tablet: 1 tab(s) orally once a day, Status: Active, Quantity: 30, Refills: 2   Compazine 10 mg oral tablet: 1 tab(s) orally every 6 hours, As Needed - for Nausea, Vomiting, Status: Active, Quantity: 120, Refills: 5   Metoprolol Succinate ER 50 mg oral tablet, extended release: 0.5 tab(s) orally once a day (in the morning), Status: Active, Quantity: 0, Refills: None   carisoprodol 350 mg oral tablet: 0.5-1 tab(s) orally once a day (at bedtime), As Needed for muscle spasms, Status: Active, Quantity: 0, Refills: None  Laboratory Results: Routine Chem:  22-Jul-14 04:38   Glucose, Serum  152  BUN 13  Creatinine (comp) 0.79  Sodium, Serum  130  Potassium, Serum 3.9  Chloride, Serum  95  CO2, Serum 26  Calcium (Total), Serum 8.8  Anion Gap 9  Osmolality (calc) 264  eGFR (African American) >60  eGFR (Non-African American) >60 (eGFR values <25m/min/1.73 m2 may be an indication of chronic kidney disease (CKD). Calculated eGFR is useful in patients with stable renal function. The eGFR calculation will not be reliable in acutely ill patients when serum creatinine is changing rapidly. It is not useful in  patients on dialysis. The eGFR calculation may not be applicable to patients at the low and high extremes of body sizes, pregnant women, and vegetarians.)  Cardiac:  22-Jul-14 04:38   CK, Total 90  CPK-MB, Serum 3.0 (Result(s) reported on 03 Jun 2013 at 05:24AM.)  Troponin I < 0.02 (0.00-0.05 0.05 ng/mL or less: NEGATIVE  Repeat testing in 3-6 hrs  if clinically indicated. >0.05 ng/mL: POTENTIAL  MYOCARDIAL INJURY. Repeat  testing in 3-6 hrs if  clinically indicated. NOTE: An increase or decrease  of 30% or more on serial  testing suggests a  clinically important change)  Routine Hem:  22-Jul-14 04:38   WBC (CBC) 4.0  RBC (CBC)  3.54  Hemoglobin (CBC)  11.2   Hematocrit (CBC)  32.9  Platelet Count (CBC) 236  MCV 93  MCH 31.6  MCHC 34.0  RDW 13.6  Neutrophil % 94.5  Lymphocyte % 4.3  Monocyte % 1.0  Eosinophil % 0.1  Basophil % 0.1  Neutrophil # 3.7  Lymphocyte #  0.2  Monocyte #  0.0  Eosinophil # 0.0  Basophil # 0.0 (Result(s) reported on 03 Jun 2013 at 06:18AM.)   Radiology Results: XRay:    21-Jul-14 12:42, Chest Portable Single View  Chest Portable Single View   REASON FOR EXAM:    Shortness of Breath  COMMENTS:       PROCEDURE: DXR - DXR PORTABLE CHEST SINGLE VIEW  - Jun 02 2013 12:42PM     RESULT: Comparison is made to the study of May 06, 2011.    The right lung is adequately inflated. There is no focal infiltrate. On   the left the lower one half of the hemithorax is opacified presumably   with pleural fluid. There is a Port-A-Cath appliance in place with the   tip the catheter in the region of the proximal SVC. The right heart   border appears normal.  The central pulmonary vascularity is prominent.    IMPRESSION:    1. There is a moderate sized left pleural effusion. I cannot exclude     underlying atelectasis or mass. This has increased since the April 15, 2011   study.  2. The right lung exhibits no abnormal masses or pleural fluid.  3. Mild prominence of the central pulmonary vascularity may reflect mild   CHF in the appropriate clinical setting.     Dictation Site: 2        Verified By: DAVID A. 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  Plan:   1. Breast cancer: Pathology results confirm recurrent malignancy.  ER positive, PR negative.  Patient received cycle 1, day 1 of Halaven on May 26, 2013.  Will hold any further treatments for now and reconsider when her acute symptoms resolve.  We will also consult palliative care. Malignant pleural effusion: Have recommended ttherapeutic thoracentesis,  for which the patient is agreeable too.  If herconfusion reaccumulate, would consider consultation with Dr. Nestor Lewandowsky for thoracotomy and talc placement.  consult, will follow.   Electronic Signatures: Delight Hoh (MD)  (Signed 22-Jul-14 17:25)  Authored: HISTORY OF PRESENT ILLNESS, PFSH, ROS, NURSING NOTES, PE, ALLERGIES, HOME MEDICATIONS, LABS, OTHER RESULTS, ASSESSMENT AND PLAN   Last Updated: 22-Jul-14 17:25 by Delight Hoh (MD)

## 2015-03-05 NOTE — Consult Note (Signed)
PATIENT NAME:  Courtney Chang, Courtney Chang MR#:  [id number] DATE OF BIRTH:  1926-06-27  DATE OF CONSULTATION:  06/25/2013  REFERRING PHYSICIAN:  Dr. Bridgett Larsson.  CONSULTING PHYSICIAN:  Kary Sugrue R. Ma Hillock, MD  REASON FOR CONSULTATION: Metastatic breast cancer, pleural effusion.   HISTORY OF PRESENT ILLNESS: The patient is an 81 year old female patient with known history of metastatic stage IV breast cancer. Recently diagnosed with malignant pleural effusion and underwent talc pleurodesis by Dr. Genevive Bi, following which she started on first dose of palliative chemotherapy with [facility] on August 4th. The patient had been sent to rehab facility but states that she was unable to do much over there. She has been admitted with progressive symptoms, including weakness, shortness of breath, cough and worsening leg edema. She denies any pain issues. Lower extremity Doppler is negative for DVT. Oral intake has been poor. She also had tachycardia in the Emergency Room. She had a chest x-ray today which showed abnormal left lower hemithorax consistent with pleural effusion and atelectasis, increased interstitial markings of both lungs consistent with low-grade CHF.    PAST MEDICAL HISTORY AND PAST SURGICAL HISTORY:  1. Stage IV breast cancer as described.  2. Hypertension.  3. Hyperlipidemia.  4. Chronic obstructive pulmonary disease.  5. Osteoarthritis.  6. Degenerative disk disease.  7. CHF.   8. Neuropathy.  9. Basal cell skin cancer excision from right toe.  10. Bilateral corneal transplant.  11. Knee replacement.  12. Right mastectomy.  13. AAA repair.   FAMILY HISTORY: Noncontributory.   SOCIAL HISTORY: Smoking less than 1 pack per day. Denies alcohol usage. Physically not active.   ALLERGIES: INCLUDE PREDNISONE, PREMARIN, PENICILLIN, ADHESIVE.   HOME MEDICATIONS: Zofran 4 mg every 6 hours p.r.n., Tylenol 650 mg q.6 hours p.r.n., vitamin C 500 mg daily, trazodone 50 mg at bedtime, Symbicort inhaler 2 puffs b.i.d.,  Spiriva 18 mcg daily, Soma 350 mg t.i.d. p.r.n., Singulair 10 mg at bedtime, Senokot 1 tablet b.i.d., pantoprazole 40 mg daily, Ocuvite antioxidant multivitamin, Norco 320/5 mg 1 tablet q.4 hours, Lopressor ER 50 mg 1/2 tablet daily, mineral oil 1 capsule daily, lisinopril 20 mg daily, levothyroxine 50 mcg daily, Lasix 20 mg daily, DuoNeb SVN 3 mL inhaler q.4 hours p.r.n.Marland Kitchen Crestor 10 mg daily, Cipro 500 mg b.i.d., Ativan 0.5 mg q.6 hours, albuterol inhaler q.4 hours p.r.n.   REVIEW OF SYSTEMS:  CONSTITUTIONAL: As in HPI. No fever or chills.  HEENT: Denies headaches or dizziness at rest. No epistaxis, ear or jaw pain.  CARDIAC: Has dyspnea on exertion and at rest. Denies chest pain, palpitations. Mild orthopnea.  LUNGS: As in HPI. No hemoptysis.  GASTROINTESTINAL: Intermittent nausea. No vomiting or diarrhea. No bright red blood in stools or melena.  GENITOURINARY: No dysuria or hematuria.  SKIN: No new rashes or pruritus.  HEMATOLOGIC: Denies obvious bleeding issues.  EXTREMITIES: Having progressive leg swelling with some discomfort, no pain.  MUSCULOSKELETAL: Has chronic back pain.  NEUROLOGIC: Denies seizures, loss of consciousness or major headaches.  ENDOCRINE: No polyuria or polydipsia. Appetite is poor.   PHYSICAL EXAMINATION:  GENERAL: The patient is elderly, weak looking, resting in bed on nasal cannula oxygen, but otherwise alert and oriented to self, place, person and time and converses appropriately. No acute distress at rest.  VITAL SIGNS: 98.5, 142, 20, 107/63, 96% on 2 liters.  HEENT: Normocephalic, atraumatic. Extraocular movements intact. Sclerae anicteric. Mouth is slightly dry.  NECK: Negative for lymphadenopathy.  CARDIOVASCULAR: S1, S2, mildly tachycardic, regular.  LUNGS: Bilateral diminished breath sounds,  more so in the bases in the left lower lobe area, no rhonchi.  ABDOMEN: Soft, mild epigastric tenderness. No hepatomegaly clinically.  EXTREMITIES: Bilateral 2+  pitting edema, no cyanosis.  NEUROLOGIC: Limited examination. Cranial nerves seem intact. Moves all extremities spontaneously.  MUSCULOSKELETAL: No obvious joint redness or swelling.  SKIN: Minor bruising. No generalized rashes.   LABORATORY RESULTS: WBC 2800 with 58% neutrophils, ANC 1700, platelets 344, hemoglobin 8.5, troponin I less than 0.02, BNP 738, creatinine 0.78, potassium 4.0, calcium 9.1. Liver functions unremarkable except albumin of 2.3.   IMPRESSION AND RECOMMENDATIONS: An 81 year old female patient with history of multiple medical problems as described above, including recently diagnosed recurrent metastatic stage IV breast cancer presenting as malignant pleural effusion. In addition, CT scan of the chest on July 22nd also showed multiple hepatic lesions consistent with metastatic disease. The patient has just been started on Halaven, first dose on August 4th, reportedly unable to get second dose on August 11th due to confusion and possible UTI. Currently admitted with respiratory symptoms, including dyspnea, cough, and also has progressive lower extremity edema which is likely multifactorial given her history of heart disease, chronic obstructive pulmonary disease, low albumin with poor nutrition, stage IV malignancy. I have explained to the patient and her daughter present at bedside that she currently has extremely poor performance status (ECOG 3 to 4) and is not a candidate to tolerate any kind of chemotherapy. CBC otherwise shows persistent anemia but no major neutropenia or thrombocytopenia. Continue to monitor CBC and differential intermittently during hospitalization. Palliative care has also been consulted. The patient and daughter also explained that overall prognosis seems extremely poor given her declining condition and stage IV malignancy and have discussed code status, but the patient wants to remain FULL CODE at this time. Will continue to follow during hospitalization as  indicated. Please call if any questions.   Thank you for the referral. Please feel to contact me if any additional questions.    ____________________________ Rhett Bannister Ma Hillock, MD srp:gb D: 06/26/2013 00:11:37 ET T: 06/26/2013 00:31:20 ET JOB#: [id number]  cc: Trevor Iha R. Ma Hillock, MD, <Dictator> Alveta Heimlich MD ELECTRONICALLY SIGNED 06/26/2013 14:38

## 2015-03-05 NOTE — Consult Note (Signed)
Brief Consult Note: Diagnosis: Left malignant pleural effusion.   Patient was seen by consultant.   Consult note dictated.   Recommend to proceed with surgery or procedure.   Discussed with Attending MD.   Comments: Extensive review of all options for management of her malignant pleural effusion.  She would like to proceed with thoracoscopy with talc pleurodesis.  We will ask Dr. Nehemiah Massed in cardiology to see patient.  If he believes her risks to be acceptable, we will proceed on Monday.  She and son are in agreement.  Electronic Signatures: Louis Matte (MD)  (Signed 25-Jul-14 13:29)  Authored: Brief Consult Note   Last Updated: 25-Jul-14 13:29 by Louis Matte (MD)

## 2015-03-05 NOTE — Op Note (Signed)
PATIENT NAME:  Courtney Chang, Courtney Chang MR#:  [id number] DATE OF BIRTH:  [birth year]  DATE OF PROCEDURE:  06/09/2013  PREOPERATIVE DIAGNOSIS: Malignant pleural effusion, left side.   POSTOPERATIVE DIAGNOSIS: Malignant pleural effusion, left side.   OPERATION PERFORMED: 1.  Preoperative bronchoscopy for diagnosis and therapy.  2.  Left thoracoscopy with talc pleurodesis.   SURGEON: Anani Gu E. Genevive Bi, M.D.   ASSISTANT: None.   INDICATIONS FOR PROCEDURE: [name] is an 81 year old white female with a history of malignant pleural effusion on the left from breast cancer. She was apprised of the indications and risks of the above-named procedure and she gave her informed consent.   DESCRIPTION OF PROCEDURE: The patient was brought to the operating suite and placed in the supine position. General endotracheal anesthesia was given with a double-lumen tube. Preoperative bronchoscopy was carried out. The right side was clear. The left side revealed extensive mucoid secretions in both upper and lower lobes. A considerable period time was spent clearing all the major airways of the mucoid secretions. Once this was complete, we could see that there was no evidence of endobronchial tumor. However, the airways themselves appeared somewhat distorted and collapse. The patient was then rolled onto her right side for left thoracoscopy. All pressure points were carefully padded. The patient was prepped and draped in the usual sterile fashion. We began by making a 2 cm long incision in the anterior axillary line at approximately the sixth intercostal space. The incision was deepened down through the muscles of the chest wall until the pleural space was entered. Upon entering the pleural space, we suctioned approximately 700 to 800 mL of fluid from the pleural space. A trocar was inserted and thoracoscopy was carried out. The left lung did not easily deflate and we placed some suction on the endotracheal tube, which did improve  visualization somewhat. There was a fairly marked loss of elastic recoil of the lung. There were minimal adhesions within the pleural space. There was extensive tumor deposits coating nearly the entire pleural surface. After the pleural fluid was suctioned, 5 grams of sterile talc was then insufflated under direct visualization coating the pleural surface. A 28-Blake tube was then inserted and brought out through a separate stab wound. The lung was then reinflated and the wounds were closed with multiple layers of running absorbable sutures. The chest tube was secured with silk and the skin with nylon. The patient was then rolled in the supine position where she was extubated and taken to the recovery room in stable condition.   ____________________________ Lew Dawes Genevive Bi, MD teo:sb D: 06/09/2013 15:38:22 ET T: 06/09/2013 16:13:10 ET JOB#: [id number]  cc: Christia Reading E. Genevive Bi, MD, <Dictator> Kathlene November. Grayland Ormond, MD Louis Matte MD ELECTRONICALLY SIGNED 06/12/2013 8:47

## 2015-03-05 NOTE — Consult Note (Signed)
   Comments   Pt very familiar to our service from her previous hospitalization. Unsure of benefit of palliative care consult at this point. We have had multiple family meetings. Pt was DNI when she was discharged and apparently has changed her mind and is now a full code. Very poor prognosis in 81 yo woman with stage IV breast cancer.  Electronic Signatures: Loic Hobin, Kirt Boys (NP)  (Signed 14-Aug-14 16:31)  Authored: Palliative Care   Last Updated: 14-Aug-14 16:31 by Irean Hong (NP)

## 2015-03-05 NOTE — Discharge Summary (Signed)
PATIENT NAME:  Courtney, Chang MR#:  [id number] DATE OF BIRTH:  1926/06/04  DATE OF ADMISSION:  06/02/2013 DATE OF DISCHARGE:    PRESENTING COMPLAINT: Shortness of breath and cough.   PRIMARY CARE PHYSICIAN: Dr. Benita Stabile.   CONSULTATIONS:  1.  Dr. Grayland Ormond, oncology.  2.  Pulmonary, Dr. Mortimer Fries.  3.  Dr. Genevive Bi, cardiothoracic.   DISCHARGE DIAGNOSES:  1.  Malignant pleural effusion suspected due to metastatic breast cancer. The patient is status post thoracentesis and pleurodesis with talc procedure.  2.  Acute respiratory failure due to chronic obstructive pulmonary disease exacerbation and left-sided pleural effusion.  3.  Metastatic breast cancer followed by Dr. Grayland Ormond.  4.  Generalized weakness and debility.  5.  Hyperlipidemia.  6.  Chronic systolic congestive heart failure, ejection fraction of 40%.  7.  Leg edema.  CODE STATUS: Limited code, which includes no intubation and no mechanical ventilator. The patient is okay with CPR, antiarrhythmic drugs, vasoactive drugs and defibrillator.   DIET: 2 gram sodium.   DISCHARGE INSTRUCTIONS: Physical therapy. DuoNeb q.4 hours p.r.n. as needed.  Oxygen 2 L per minute only if needed to keep sats greater than 92%.   FOLLOWUP: With Dr. Grayland Ormond on 08/04, Monday appointment at the [facility].   MEDICATIONS AT DISCHARGE:  1.  Tylenol 650 mg q.4 hours p.r.n.  2.  Zofran 4 mg q.6 hours p.r.n.  3.  Lorazepam 0.5 mg q.6 hourly p.r.n.  4.  Crestor 10 mg at bedtime.  5.  Symbicort 160 /4.5 mg  2 puffs b.i.d.  6.  Singulair 10 mg daily.  7.  Lovaza 1 gram p.o. daily.  8.  Synthroid 0.05 mg p.o. daily.  9.  Ocuvite PreserVision p.o. daily.  10. Toprol-XL 25 mg daily.  11. Protonix 40 mg daily.  12. Norco 5/325 mg, 1 tablet q.4  hours p.r.n.  13. Nystatin apply to affected area b.i.d. p.r.n.  14. Trazodone 50 mg at bedtime p.r.n.  15. Soma 350 mg p.o. t.i.d. p.r.n.  16. Albuterol oral inhaler 2 puffs q.4 hours p.r.n.  17. Lasix 20 mg  daily.  18. Vitamin C 500 mg daily. 19. MVI p.o. daily.  20. Lisinopril 20 mg daily.    LABS AT DISCHARGE: Chest x-ray from July 31,  small left pleural effusion, decreased from before. No pneumothorax. Hemoglobin and hematocrit is 12.2 and 35.9, platelet count is 215. Glucose is 122, BUN is 20, creatinine is 0.72, sodium is 132, potassium is 4.5, chloride is 98, bicarbonate is 30. cytology shows hyperplastic reactive mesothelial cells are present. Proteinaceous material present. Adenosine DMNS pleural fluid less than 1.6. Pleural fluid culture no growth in 4 days. Albumin is 2.5. Body fluid LDH is 126. White count is 4.0. Cardiac enzymes negative. Blood cultures negative in 5 days. CT of the chest on admission done showed prominent left pleural effusion, associated prominent left pleural nodularity in epicardium, epicardial lymph nodes and multiple hepatic lesions. These findings consistent of metastatic disease, status post right mastectomy.   PROCEDURES: Ultrasound-guided thoracentesis was done on 07/23 with removal of about 1100 mL of dark yellow fluid. Left thoracoscopy with talc pleurodesis done by Dr. Genevive Bi on the 28th of July.   BRIEF SUMMARY OF HOSPITAL COURSE: Courtney Chang is an 81 year old Caucasian female with history of recurrent breast cancer, metastatic now with history of COPD, hyperlipidemia and had her chemotherapy last a week before coming to the Emergency Room. She presented with shortness of breath, cough and wheezing. She was admitted  with:  1.  Acute hypoxic respiratory failure due to her COPD exacerbation and significant left pleural effusion. The patient completed a course of steroids and p.o. Levaquin. She was seen by Dr. Mortimer Fries and recommended ultrasound thoracentesis. She underwent a 1100 mL removal of yellow dark fluid under ultrasound guidance. Her sats improved. She is currently 92% on room air.  2.  Recurrent left pleural effusion. The patient did have thoracentesis 2 months  back and had thoracentesis on 07/23 with removal of 1100 mL. Her recurrent pleurodesis was performed by Dr. Genevive Bi. The patient's chest tube was removed. Her sats remain 93% on room air.  3.  Metastatic breast cancer with hepatic lesions and recurrent left pleural effusion. The patient is following with Dr. Grayland Ormond. She will follow up with her on 08/04 for possible re-initiation of chemotherapy.  4.  Hyperlipidemia. Continue Crestor.  5.  Chronic systolic congestive heart failure. The patient did not seem to be in florid heart failure. She did have some chronic leg edema, which got a little bit exacerbated and her Lasix was resumed.  6.  Generalized weakness with debility given her metastatic breast cancer. The patient was seen by physical therapy, who recommended rehab. Initially the patient's son and daughter wanted the patient to go home with home health; however, after discussing with Dr. Grayland Ormond and visiting the places, they are agreeable on her going to [facility]. The patient will be discharged today.   CODE STATUS: Limited code.   TIME SPENT: 40 minutes.  ____________________________ Hart Rochester Posey Pronto, MD sap:aw D: 06/13/2013 10:54:18 ET T: 06/13/2013 11:06:58 ET JOB#: [id number]  cc: Jarryd Gratz A. Posey Pronto, MD, <Dictator> Kathlene November. Grayland Ormond, MD Leona Carry Hall Busing, [place] Genevive Bi, MD Mariane Duval, MD Ilda Basset MD ELECTRONICALLY SIGNED 07/03/2013 7:29

## 2015-03-05 NOTE — Consult Note (Signed)
PATIENT NAME:  Courtney Chang, RANA MR#:  [id number] DATE OF BIRTH:  10/28/1926  DATE OF CONSULTATION:  06/06/2013  REFERRING PHYSICIAN: Dr. Posey Pronto  CONSULTING PHYSICIAN:  Isaias Cowman, MD ONCOLOGIST: Kathlene November. Grayland Ormond, MD  REASON FOR CONSULTATION: Preoperative cardiovascular evaluation.   CHIEF COMPLAINT: Shortness of breath.   HISTORY OF PRESENT ILLNESS: The patient is an 81 year old female with known history of breast cancer, with a recent history of left pleural effusion with thoracentesis twice over the past month. Pleural fluid is consistent with malignant pleural effusion. The patient is admitted at this time for repeat thoracentesis. The patient was seen by Dr. Genevive Bi who is recommending thoracotomy with talc pleurodesis. The patient has agreed with the procedure. She is referred for preoperative cardiovascular evaluation. The patient is followed by Dr. Nehemiah Massed as an outpatient. She has a history of hypertension, hyperlipidemia, abdominal aortic aneurysm repair with endovascular stent. The patient has no prior history of myocardial infarction, diabetes, chronic kidney disease or prior stroke. The patient does have pedal edema, which is likely multifactorial. Recent echocardiogram has shown normal left ventricular function; therefore, it is unlikely that the patient has systolic congestive heart failure.   PAST MEDICAL HISTORY: 1.  Hypertension.  2.  Hyperlipidemia.  3.  Abdominal aortic aneurysm repair with endovascular stent. 4.  History of breast cancer.  5.  Malignant pleural effusion   MEDICATIONS: Crestor 10 mg daily, furosemide 20 mg daily, lisinopril 20 mg daily, Centrum 1 daily, Compazine 10 mg p.o. q.6 p.r.n., levothyroxine 50 mcg daily, lorazepam 0.6 mg q.6 p.r.n., metoprolol succinate 25 mg daily, potassium gluconate 1 tab daily, ProAir 2 puffs b.i.d., Singulair 10 mg daily, Symbicort 160/4.5 two puffs b.i.d.   SOCIAL HISTORY: The patient currently is divorced. She lives by  herself in [place]. She has smoked a pack of cigarettes a day for 60 years and continues to smoke.   FAMILY HISTORY: No immediate family history of coronary disease or myocardial infarction.   REVIEW OF SYSTEMS:    CONSTITUTIONAL: No fever or chills.  EYES: No blurry vision.  EARS: No hearing loss.  RESPIRATORY: The patient has shortness of breath due to underlying COPD and pleural effusion.  CARDIOVASCULAR: The patient denies chest pain.  GASTROINTESTINAL: No nausea, vomiting, diarrhea.  GENITOURINARY: No dysuria or hematuria.  ENDOCRINE: No polyuria or polydipsia.  MUSCULOSKELETAL: No arthralgias or myalgias.  NEUROLOGICAL: No focal muscle weakness or numbness.  PSYCHOLOGICAL: No depression or anxiety.   PHYSICAL EXAMINATION: VITAL SIGNS: Blood pressure 166/72, pulse 90, respirations 20, temperature 97.8, pulse oximetry 93%.  HEENT: Pupils equal, reactive to light and accommodation.  NECK: Supple without thyromegaly.  LUNGS: Decreased breath sounds in both bases with fine crackles heard in the left base.  CARDIOVASCULAR: Normal JVP. Normal PMI. Regular rate and rhythm. Normal S1, S2. Grade I/VI to II/VI systolic murmur.  ABDOMEN: Soft, nontender, without hepatosplenomegaly.  EXTREMITIES: The patient has 1 to 2+ bilateral pedal edema.  MUSCULOSKELETAL: Normal muscle tone.  NEUROLOGIC: The patient is alert and oriented x 3. Motor and sensory both grossly intact.   IMPRESSION: This is an 81 year old female awaiting thoracotomy with talc pleurodesis for malignant effusion. The patient does not have any high-risk features for serious cardiovascular complication. The patient has no prior history of myocardial infarction, diabetes, prior stroke or chronic kidney disease. The patient has a questionable history of congestive heart failure due to new-onset pedal edema; however, she has known normal left ventricular function.   RECOMMENDATIONS: 1.  Agree with current therapy.  2.  Would defer  further cardiac diagnostics at this time.  3.  Would proceed with thoracotomy and talc pleurodesis on 06/09/2013 as planned. 4.  Continue metoprolol pre- and peri- and postoperatively.   ____________________________ Isaias Cowman, MD ap:jm D: 06/06/2013 18:01:00 ET T: 06/06/2013 20:18:30 ET JOB#: [id number]  cc: Isaias Cowman, MD, <Dictator> Isaias Cowman MD ELECTRONICALLY SIGNED 06/07/2013 10:12
